# Patient Record
Sex: Female | Born: 1995 | Race: White | Hispanic: No | Marital: Single | Smoking: Former smoker
Health system: Southern US, Community
[De-identification: ages and names within clinical notes are randomized; demographics above are authoritative.]

## PROBLEM LIST (undated history)

## (undated) ENCOUNTER — Inpatient Hospital Stay (HOSPITAL_COMMUNITY): Payer: Self-pay

## (undated) DIAGNOSIS — F32A Depression, unspecified: Secondary | ICD-10-CM

## (undated) DIAGNOSIS — A749 Chlamydial infection, unspecified: Secondary | ICD-10-CM

## (undated) DIAGNOSIS — Z8659 Personal history of other mental and behavioral disorders: Secondary | ICD-10-CM

## (undated) DIAGNOSIS — F419 Anxiety disorder, unspecified: Secondary | ICD-10-CM

## (undated) DIAGNOSIS — F329 Major depressive disorder, single episode, unspecified: Secondary | ICD-10-CM

## (undated) HISTORY — DX: Personal history of other mental and behavioral disorders: Z86.59

## (undated) HISTORY — DX: Chlamydial infection, unspecified: A74.9

## (undated) HISTORY — DX: Major depressive disorder, single episode, unspecified: F32.9

## (undated) HISTORY — DX: Depression, unspecified: F32.A

## (undated) HISTORY — DX: Anxiety disorder, unspecified: F41.9

---
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
## 2016-01-06 ENCOUNTER — Encounter: Payer: Self-pay | Admitting: Obstetrics & Gynecology

## 2016-01-06 ENCOUNTER — Other Ambulatory Visit (HOSPITAL_COMMUNITY)
Admission: RE | Admit: 2016-01-06 | Discharge: 2016-01-06 | Disposition: A | Discharge status: Home / Self Care | Payer: Medicaid Other | Source: Ambulatory Visit | Attending: Obstetrics & Gynecology | Admitting: Obstetrics & Gynecology

## 2016-01-06 ENCOUNTER — Encounter (INDEPENDENT_AMBULATORY_CARE_PROVIDER_SITE_OTHER): Payer: Self-pay

## 2016-01-06 ENCOUNTER — Ambulatory Visit (INDEPENDENT_AMBULATORY_CARE_PROVIDER_SITE_OTHER): Payer: Medicaid Other | Admitting: Obstetrics & Gynecology

## 2016-01-06 VITALS — BP 118/71 | HR 98 | Ht 64.0 in | Wt 170.0 lb

## 2016-01-06 DIAGNOSIS — Z113 Encounter for screening for infections with a predominantly sexual mode of transmission: Secondary | ICD-10-CM

## 2016-01-06 DIAGNOSIS — Z3492 Encounter for supervision of normal pregnancy, unspecified, second trimester: Secondary | ICD-10-CM

## 2016-01-06 DIAGNOSIS — Z3490 Encounter for supervision of normal pregnancy, unspecified, unspecified trimester: Secondary | ICD-10-CM

## 2016-01-06 NOTE — Progress Notes (Signed)
  Subjective:    [name] is a G1P0 3311w3d being seen today for her first obstetrical visit.  Her obstetrical history is significant for THC smoker, anxiety, depression, food insecurtiy, hx of recent chlamydia infection. Patient does intend to breast feed. Pregnancy history fully reviewed.  Patient reports mild nausea nad vomiting when she doesn't eat food..  Vitals:   01/06/16 1424 01/06/16 1426  BP: 118/71   Pulse: 98   Weight: 170 lb (77.1 kg)   Height:  5\' 4"  (1.626 m)    HISTORY: OB History  Gravida Para Term Preterm AB Living  1            SAB TAB Ectopic Multiple Live Births               # Outcome Date GA Lbr Len/2nd Weight Sex Delivery Anes PTL Lv  1 Current              Past Medical History:  Diagnosis Date  . Anxiety   . Chlamydia 08/2015  . Depression    History reviewed. No pertinent surgical history. Family History  Problem Relation Age of Onset  . Diabetes Father   . Stroke Maternal Grandmother   . Diabetes Paternal Grandmother   . Breast cancer Other   . Diabetes Other   . Heart disease Other      Exam    Uterus:     Pelvic Exam:    Perineum: Hemorrhoids   Vulva: normal   Vagina:  normal mucosa   pH: n/a   Cervix: no cervical motion tenderness   Adnexa: normal adnexa   Bony Pelvis: gynecoid  System: Breast:  normal appearance, no masses or tenderness   Skin: normal coloration and turgor, no rashes, old scars from cutting on abdomen and thighs.  ?folliculitis on left lE    Neurologic: oriented, normal mood   Extremities: normal strength, tone, and muscle mass   HEENT sclera clear, anicteric   Mouth/Teeth mucous membranes moist, pharynx normal without lesions   Neck supple and no masses   Cardiovascular: regular rate and rhythm   Respiratory:  appears well, vitals normal, no respiratory distress, acyanotic, normal RR, chest clear, no wheezing, crepitations, rhonchi, normal symmetric air entry   Abdomen: soft, non-tender; bowel sounds  normal; no masses,  no organomegaly   Urinary: urethral meatus normal      Assessment:    Pregnancy: G1P0 There are no active problems to display for this patient.       Plan:     Initial labs drawn. Prenatal vitamins. Problem list reviewed and updated. Genetic Screening discussed Quad Screen: ordered.  Ultrasound discussed; fetal survey: requested.and ordered  Follow up in 4 weeks.  1. TOC for recent Chlamydia treatment 2.  [facility] for [facility] 3.  SW for food insecurity 4.  Hydrocortisone to Left lower extremity for papular lesions    Emira Eubanks H. 01/06/2016

## 2016-01-07 ENCOUNTER — Encounter: Payer: Self-pay | Admitting: *Deleted

## 2016-01-07 LAB — OBSTETRIC PANEL
Antibody Screen: NEGATIVE
BASOS PCT: 0 %
Basophils Absolute: 0 cells/uL (ref 0–200)
EOS ABS: 84 {cells}/uL (ref 15–500)
Eosinophils Relative: 1 %
HCT: 34.7 % (ref 34.0–46.0)
HEP B S AG: NEGATIVE
Hemoglobin: 11.6 g/dL (ref 11.5–15.3)
LYMPHS ABS: 1512 {cells}/uL (ref 1200–5200)
Lymphocytes Relative: 18 %
MCH: 28.5 pg (ref 25.0–35.0)
MCHC: 33.4 g/dL (ref 31.0–36.0)
MCV: 85.3 fL (ref 78.0–98.0)
MONO ABS: 588 {cells}/uL (ref 200–900)
MPV: 9.4 fL (ref 7.5–12.5)
Monocytes Relative: 7 %
NEUTROS ABS: 6216 {cells}/uL (ref 1800–8000)
Neutrophils Relative %: 74 %
Platelets: 212 10*3/uL (ref 140–400)
RBC: 4.07 MIL/uL (ref 3.80–5.10)
RDW: 15.1 % — AB (ref 11.0–15.0)
RUBELLA: 3.26 {index} — AB (ref <0.90)
Rh Type: NEGATIVE
WBC: 8.4 10*3/uL (ref 4.5–13.0)

## 2016-01-07 LAB — WET PREP BY MOLECULAR PROBE
CANDIDA SPECIES: NEGATIVE
Gardnerella vaginalis: NEGATIVE
Trichomonas vaginosis: NEGATIVE

## 2016-01-07 LAB — AFP, QUAD SCREEN
AFP: 33.3 ng/mL
Age Alone: 1:1190 {titer}
CURR GEST AGE: 15.4 wk
HCG TOTAL: 61.28 [IU]/mL
INH: 81.8 pg/mL
INTERPRETATION-AFP: NEGATIVE
MOM FOR AFP: 1.15
MOM FOR HCG: 1.45
MOM FOR INH: 0.5
OPEN SPINA BIFIDA: NEGATIVE
Osb Risk: 1:8040 {titer}
TRI 18 SCR RISK EST: NEGATIVE
uE3 Mom: 1.2
uE3 Value: 0.79 ng/mL

## 2016-01-07 LAB — HIV ANTIBODY (ROUTINE TESTING W REFLEX): HIV: NONREACTIVE

## 2016-01-08 ENCOUNTER — Encounter: Payer: Self-pay | Admitting: Obstetrics & Gynecology

## 2016-01-08 DIAGNOSIS — Z3400 Encounter for supervision of normal first pregnancy, unspecified trimester: Secondary | ICD-10-CM | POA: Insufficient documentation

## 2016-01-08 LAB — GC/CHLAMYDIA PROBE AMP (~~LOC~~) NOT AT ARMC
CHLAMYDIA, DNA PROBE: NEGATIVE
NEISSERIA GONORRHEA: NEGATIVE

## 2016-01-09 ENCOUNTER — Other Ambulatory Visit: Payer: Self-pay | Admitting: Obstetrics & Gynecology

## 2016-01-09 ENCOUNTER — Telehealth: Payer: Self-pay | Admitting: *Deleted

## 2016-01-09 DIAGNOSIS — O99891 Other specified diseases and conditions complicating pregnancy: Secondary | ICD-10-CM

## 2016-01-09 DIAGNOSIS — O9989 Other specified diseases and conditions complicating pregnancy, childbirth and the puerperium: Principal | ICD-10-CM

## 2016-01-09 DIAGNOSIS — N390 Urinary tract infection, site not specified: Secondary | ICD-10-CM

## 2016-01-09 DIAGNOSIS — R8271 Bacteriuria: Secondary | ICD-10-CM

## 2016-01-09 LAB — CULTURE, URINE COMPREHENSIVE: Colony Count: >=100000

## 2016-01-09 MED ORDER — NITROFURANTOIN MONOHYD MACRO 100 MG PO CAPS: 100.0000 mg | ORAL_CAPSULE | Freq: Two times a day (BID) | ORAL | 0 refills | Status: DC

## 2016-01-09 MED ORDER — NITROFURANTOIN MONOHYD MACRO 100 MG PO CAPS: 100.0000 mg | ORAL_CAPSULE | Freq: Two times a day (BID) | ORAL | 1 refills | Status: DC

## 2016-01-09 NOTE — Telephone Encounter (Signed)
Pt notified of UTI from urine culture.  Verbal [facility]ove [facility]  [facility] [place]

## 2016-01-13 ENCOUNTER — Inpatient Hospital Stay (HOSPITAL_COMMUNITY)
Admission: AD | Admit: 2016-01-13 | Discharge: 2016-01-13 | Disposition: A | Discharge status: Home / Self Care | Payer: Medicaid Other | Source: Ambulatory Visit | Attending: Family Medicine | Admitting: Family Medicine

## 2016-01-13 DIAGNOSIS — O219 Vomiting of pregnancy, unspecified: Secondary | ICD-10-CM | POA: Diagnosis present

## 2016-01-13 DIAGNOSIS — O2342 Unspecified infection of urinary tract in pregnancy, second trimester: Secondary | ICD-10-CM | POA: Diagnosis not present

## 2016-01-13 DIAGNOSIS — Z3A16 16 weeks gestation of pregnancy: Secondary | ICD-10-CM | POA: Insufficient documentation

## 2016-01-13 LAB — URINE MICROSCOPIC-ADD ON: RBC / HPF: NONE SEEN RBC/hpf (ref 0–5)

## 2016-01-13 LAB — URINALYSIS, ROUTINE W REFLEX MICROSCOPIC
BILIRUBIN URINE: NEGATIVE
Glucose, UA: NEGATIVE mg/dL
HGB URINE DIPSTICK: NEGATIVE
Ketones, ur: NEGATIVE mg/dL
NITRITE: POSITIVE — AB
PROTEIN: NEGATIVE mg/dL
SPECIFIC GRAVITY, URINE: 1.02 (ref 1.005–1.030)
pH: 6.5 (ref 5.0–8.0)

## 2016-01-13 MED ORDER — METOCLOPRAMIDE HCL 10 MG PO TABS: 10.0000 mg | ORAL_TABLET | Freq: Three times a day (TID) | ORAL | 1 refills | Status: DC

## 2016-01-13 MED ORDER — ONDANSETRON 8 MG PO TBDP
8.0000 mg | ORAL_TABLET | Freq: Once | ORAL | Status: AC
  Administered 2016-01-13: 8 mg via ORAL
  Filled 2016-01-13: qty 1

## 2016-01-13 MED ORDER — CEPHALEXIN 500 MG PO CAPS: 500.0000 mg | ORAL_CAPSULE | Freq: Four times a day (QID) | ORAL | 0 refills | Status: DC

## 2016-01-13 NOTE — MAU Provider Note (Signed)
History     CSN: [id number]  Arrival date and time: 01/13/16 1314   First Provider Initiated Contact with Patient 01/13/16 1520      Chief Complaint  Patient presents with  . Emesis During Pregnancy  . Fatigue   HPI   Ms.Joann Perez is a 20 y.o. female G1P0 at 5647w3d here with nausea and vomiting. She has vomited twice in the last 24 hours. Her family member told her that she needed to come in for IV fluids. Patient was recently diagnosed with a UTI, however has not picked up her RX due to cost.   Patient is homeless and feels that she has resources; she declines resources from us today.   OB History    Gravida Para Term Preterm AB Living   1             SAB TAB Ectopic Multiple Live Births                  Past Medical History:  Diagnosis Date  . Anxiety   . Chlamydia 08/2015  . Depression     No past surgical history on file.  Family History  Problem Relation Age of Onset  . Diabetes Father   . Stroke Maternal Grandmother   . Diabetes Paternal Grandmother   . Breast cancer Other   . Diabetes Other   . Heart disease Other     Social History  Substance Use Topics  . Smoking status: Former Smoker    Quit date: 01/06/2015  . Smokeless tobacco: Never Used  . Alcohol use No    Allergies:  Allergies  Allergen Reactions  . Zithromax [Azithromycin]     Prescriptions Prior to Admission  Medication Sig Dispense Refill Last Dose  . nitrofurantoin, macrocrystal-monohydrate, (MACROBID) 100 MG capsule Take 1 capsule (100 mg total) by mouth 2 (two) times daily. 20 capsule 0   . nitrofurantoin, macrocrystal-monohydrate, (MACROBID) 100 MG capsule Take 1 capsule (100 mg total) by mouth 2 (two) times daily. 14 capsule 1    Results for orders placed or performed during the hospital encounter of 01/13/16 (from the past 48 hour(s))  Urinalysis, Routine w reflex microscopic (not [facility])     Status: Abnormal   Collection Time: 01/13/16  2:05 PM  Result Value Ref Range    Color, Urine YELLOW YELLOW   APPearance CLEAR CLEAR   Specific Gravity, Urine 1.020 1.005 - 1.030   pH 6.5 5.0 - 8.0   Glucose, UA NEGATIVE NEGATIVE mg/dL   Hgb urine dipstick NEGATIVE NEGATIVE   Bilirubin Urine NEGATIVE NEGATIVE   Ketones, ur NEGATIVE NEGATIVE mg/dL   Protein, ur NEGATIVE NEGATIVE mg/dL   Nitrite POSITIVE (A) NEGATIVE   Leukocytes, UA TRACE (A) NEGATIVE  Urine microscopic-add on     Status: Abnormal   Collection Time: 01/13/16  2:05 PM  Result Value Ref Range   Squamous Epithelial / LPF 0-5 (A) NONE SEEN   WBC, UA 0-5 0 - 5 WBC/hpf   RBC / HPF NONE SEEN 0 - 5 RBC/hpf   Bacteria, UA MANY (A) NONE SEEN    Review of Systems  Constitutional: Negative for chills and fever.  Gastrointestinal: Positive for abdominal pain (Occasional burning sensation in the center of her abdomen. None now. ).  Genitourinary: Positive for dysuria.  Musculoskeletal: Negative for back pain.   Physical Exam   Blood pressure 113/63, pulse 81, temperature 98.2 F (36.8 C), temperature source Oral, resp. rate 18, height 5\' 5"  (  1.651 m), weight 173 lb (78.5 kg), last menstrual period 09/20/2015.  Physical Exam  Constitutional: She is oriented to person, place, and time. She appears well-developed and well-nourished. No distress.  HENT:  Head: Normocephalic.  Eyes: Pupils are equal, round, and reactive to light.  Musculoskeletal: Normal range of motion.  Neurological: She is alert and oriented to person, place, and time.  Skin: Skin is warm. She is not diaphoretic.  Psychiatric: Her behavior is normal.    MAU Course  Procedures  None  MDM   Urine does not show signs of dehydration  + fetal heart tones via doppler Zofran 8 mg PO; patient tolerating PO fluids and crackers. No vomiting noted while in triage.  Urine culture pending   Assessment and Plan   A:  1. UTI in pregnancy, antepartum, second trimester   2. Nausea and vomiting during pregnancy     P:  Discharge  home in stable condition Change RX from Macrobid to Keflex for UTI Rx: Reglan  Urine culture pending Return to MAU for emergencies.  Start prenatal care; patient awaiting medicaid   Duane LopeJennifer I Anastasia Tompson, NP 01/13/2016 6:10 PM

## 2016-01-13 NOTE — Discharge Instructions (Signed)
Morning Sickness °Morning sickness is when you feel sick to your stomach (nauseous) during pregnancy. You may feel sick to your stomach and throw up (vomit). You may feel sick in the morning, but you can feel this way any time of day. Some women feel very sick to their stomach and cannot stop throwing up (hyperemesis gravidarum). °HOME CARE °· Only take medicines as told by your doctor. °· Take multivitamins as told by your doctor. Taking multivitamins before getting pregnant can stop or lessen the harshness of morning sickness. °· Eat dry toast or unsalted crackers before getting out of bed. °· Eat 5 to 6 small meals a day. °· Eat dry and bland foods like rice and baked potatoes. °· Do not drink liquids with meals. Drink between meals. °· Do not eat greasy, fatty, or spicy foods. °· Have someone cook for you if the smell of food causes you to feel sick or throw up. °· If you feel sick to your stomach after taking prenatal vitamins, take them at night or with a snack. °· Eat protein when you need a snack (nuts, yogurt, cheese). °· Eat unsweetened gelatins for dessert. °· Wear a bracelet used for sea sickness (acupressure wristband). °· Go to a doctor that puts thin needles into certain body points (acupuncture) to improve how you feel. °· Do not smoke. °· Use a humidifier to keep the air in your house free of odors. °· Get lots of fresh air. °GET HELP IF: °· You need medicine to feel better. °· You feel dizzy or lightheaded. °· You are losing weight. °GET HELP RIGHT AWAY IF:  °· You feel very sick to your stomach and cannot stop throwing up. °· You pass out (faint). °MAKE SURE YOU: °· Understand these instructions. °· Will watch your condition. °· Will get help right away if you are not doing well or get worse. °  °This information is not intended to replace advice given to you by your health care provider. Make sure you discuss any questions you have with your health care provider. °  °Document Released: 06/18/2004  Document Revised: 06/01/2014 Document Reviewed: 10/26/2012 °Elsevier Interactive Patient Education ©2016 Elsevier Inc. ° °Pregnancy and Urinary Tract Infection °A urinary tract infection (UTI) is a bacterial infection of the urinary tract. Infection of the urinary tract can include the ureters, kidneys (pyelonephritis), bladder (cystitis), and urethra (urethritis). All pregnant women should be screened for bacteria in the urinary tract. Identifying and treating a UTI will decrease the risk of preterm labor and developing more serious infections in both the mother and baby. °CAUSES °Bacteria germs cause almost all UTIs.  °RISK FACTORS °Many factors can increase your chances of getting a UTI during pregnancy. These include: °· Having a short urethra. °· Poor toilet and hygiene habits. °· Sexual intercourse. °· Blockage of urine along the urinary tract. °· Problems with the pelvic muscles or nerves. °· Diabetes. °· Obesity. °· Bladder problems after having several children. °· Previous history of UTI. °SIGNS AND SYMPTOMS  °· Pain, burning, or a stinging feeling when urinating. °· Suddenly feeling the need to urinate right away (urgency). °· Loss of bladder control (urinary incontinence). °· Frequent urination, more than is common with pregnancy. °· Lower abdominal or back discomfort. °· Cloudy urine. °· Blood in the urine (hematuria). °· Fever.  °When the kidneys are infected, the symptoms may be: °· Back pain. °· Flank pain on the right side more so than the left. °· Fever. °· Chills. °· Nausea. °·   Vomiting. °DIAGNOSIS  °A urinary tract infection is usually diagnosed through urine tests. Additional tests and procedures are sometimes done. These may include: °· Ultrasound exam of the kidneys, ureters, bladder, and urethra. °· Looking in the bladder with a lighted tube (cystoscopy). °TREATMENT °Typically, UTIs can be treated with antibiotic medicines.  °HOME CARE INSTRUCTIONS  °· Only take over-the-counter or  prescription medicines as directed by your health care provider. If you were prescribed antibiotics, take them as directed. Finish them even if you start to feel better. °· Drink enough fluids to keep your urine clear or pale yellow. °· Do not have sexual intercourse until the infection is gone and your health care provider says it is okay. °· Make sure you are tested for UTIs throughout your pregnancy. These infections often come back.  °Preventing a UTI in the Future °· Practice good toilet habits. Always wipe from front to back. Use the tissue only once. °· Do not hold your urine. Empty your bladder as soon as possible when the urge comes. °· Do not douche or use deodorant sprays. °· Wash with soap and warm water around the genital area and the anus. °· Empty your bladder before and after sexual intercourse. °· Wear underwear with a cotton crotch. °· Avoid caffeine and carbonated drinks. They can irritate the bladder. °· Drink cranberry juice or take cranberry pills. This may decrease the risk of getting a UTI. °· Do not drink alcohol. °· Keep all your appointments and tests as scheduled.  °SEEK MEDICAL CARE IF:  °· Your symptoms get worse. °· You are still having fevers 2 or more days after treatment begins. °· You have a rash. °· You feel that you are having problems with medicines prescribed. °· You have abnormal vaginal discharge. °SEEK IMMEDIATE MEDICAL CARE IF:  °· You have back or flank pain. °· You have chills. °· You have blood in your urine. °· You have nausea and vomiting. °· You have contractions of your uterus. °· You have a gush of fluid from the vagina. °MAKE SURE YOU: °· Understand these instructions.   °· Will watch your condition.   °· Will get help right away if you are not doing well or get worse.   °  °This information is not intended to replace advice given to you by your health care provider. Make sure you discuss any questions you have with your health care provider. °  °Document Released:  09/05/2010 Document Revised: 03/01/2013 Document Reviewed: 12/08/2012 °Elsevier Interactive Patient Education ©2016 Elsevier Inc. ° °

## 2016-01-13 NOTE — MAU Note (Signed)
Pt C/O vomiting since yesterday, is nauseated but not vomiting today, diarrhea yesterday, none today.  Dx'd with UTI last week, hasn't started antibiotic yet.  Feeling weak, states her hair is falling out. Hasn't eaten anything down, ate approx 1/2 cup of food yesterday.

## 2016-01-14 LAB — CYSTIC FIBROSIS DIAGNOSTIC STUDY

## 2016-01-15 LAB — CULTURE, OB URINE: Special Requests: NORMAL

## 2016-01-20 ENCOUNTER — Other Ambulatory Visit (INDEPENDENT_AMBULATORY_CARE_PROVIDER_SITE_OTHER): Payer: Medicaid Other

## 2016-01-20 ENCOUNTER — Other Ambulatory Visit: Payer: Self-pay | Admitting: Advanced Practice Midwife

## 2016-01-20 DIAGNOSIS — N898 Other specified noninflammatory disorders of vagina: Secondary | ICD-10-CM

## 2016-01-20 DIAGNOSIS — Z202 Contact with and (suspected) exposure to infections with a predominantly sexual mode of transmission: Secondary | ICD-10-CM

## 2016-01-20 DIAGNOSIS — L298 Other pruritus: Secondary | ICD-10-CM

## 2016-01-20 DIAGNOSIS — Z113 Encounter for screening for infections with a predominantly sexual mode of transmission: Secondary | ICD-10-CM | POA: Diagnosis not present

## 2016-01-21 ENCOUNTER — Encounter (HOSPITAL_COMMUNITY): Payer: Self-pay | Admitting: Obstetrics & Gynecology

## 2016-01-21 ENCOUNTER — Other Ambulatory Visit: Payer: Medicaid Other

## 2016-01-21 LAB — GC/CHLAMYDIA PROBE AMP
CT PROBE, AMP APTIMA: NOT DETECTED
GC PROBE AMP APTIMA: NOT DETECTED

## 2016-01-22 ENCOUNTER — Other Ambulatory Visit: Payer: Medicaid Other

## 2016-01-22 ENCOUNTER — Ambulatory Visit (INDEPENDENT_AMBULATORY_CARE_PROVIDER_SITE_OTHER): Payer: Medicaid Other | Admitting: Obstetrics & Gynecology

## 2016-01-22 VITALS — BP 129/78 | HR 92 | Wt 172.0 lb

## 2016-01-22 DIAGNOSIS — N898 Other specified noninflammatory disorders of vagina: Secondary | ICD-10-CM | POA: Diagnosis not present

## 2016-01-22 DIAGNOSIS — O9989 Other specified diseases and conditions complicating pregnancy, childbirth and the puerperium: Secondary | ICD-10-CM

## 2016-01-22 DIAGNOSIS — O0972 Supervision of high risk pregnancy due to social problems, second trimester: Secondary | ICD-10-CM | POA: Diagnosis not present

## 2016-01-22 LAB — URINE CULTURE: Organism ID, Bacteria: NO GROWTH

## 2016-01-22 MED ORDER — FLUCONAZOLE 150 MG PO TABS: 150.0000 mg | ORAL_TABLET | Freq: Once | ORAL | 0 refills | Status: AC

## 2016-01-22 NOTE — Progress Notes (Signed)
History:  20 y.o. G1P0 here today for eval of vaginal discharge with itching and swelling.  Pt is also pregnant.  She denies vaginal spotting.  She has started feeling the baby move. She had chlamydia recently and is worried about recurrence.  She was tested yesterday and was negative for STI's.   The following portions of the patient's history were reviewed and updated as appropriate: allergies, current medications, past family history, past medical history, past social history, past surgical history and problem list.  Review of Systems:  Pertinent items are noted in HPI.  Objective:  Physical Exam Blood pressure 129/78, pulse 92, weight 172 lb (78 kg), last menstrual period 09/20/2015. Gen: NAD Abd: Soft, nontender and nondistended Pelvic: external genitalia: slightly edematous. Thick white discharge. Pt used Monistat yesterday so it was difficulty to tell what was cream and what was discharge.  Cervix long and closed. No bleeding noted.  Labs and Imaging No results found.  Assessment & Plan:  Leukorrhea and itching Suspect vaginal yeast infection  Diflucan 150mg   x1 F/u wet mount  Joann Perez, M.D., Evern CoreFACOG

## 2016-01-22 NOTE — Patient Instructions (Addendum)
Monilial Vaginitis Vaginitis in a soreness, swelling and redness (inflammation) of the vagina and vulva. Monilial vaginitis is not a sexually transmitted infection. CAUSES  Yeast vaginitis is caused by yeast (candida) that is normally found in your vagina. With a yeast infection, the candida has overgrown in number to a point that upsets the chemical balance. SYMPTOMS   White, thick vaginal discharge.  Swelling, itching, redness and irritation of the vagina and possibly the lips of the vagina (vulva).  Burning or painful urination.  Painful intercourse. DIAGNOSIS  Things that may contribute to monilial vaginitis are:  Postmenopausal and virginal states.  Pregnancy.  Infections.  Being tired, sick or stressed, especially if you had monilial vaginitis in the past.  Diabetes. Good control will help lower the chance.  Birth control pills.  Tight fitting garments.  Using bubble bath, feminine sprays, douches or deodorant tampons.  Taking certain medications that kill germs (antibiotics).  Sporadic recurrence can occur if you become ill. TREATMENT  Your caregiver will give you medication.  There are several kinds of anti monilial vaginal creams and suppositories specific for monilial vaginitis. For recurrent yeast infections, use a suppository or cream in the vagina 2 times a week, or as directed.  Anti-monilial or steroid cream for the itching or irritation of the vulva may also be used. Get your caregiver's permission.  Painting the vagina with methylene blue solution may help if the monilial cream does not work.  Eating yogurt may help prevent monilial vaginitis. HOME CARE INSTRUCTIONS   Finish all medication as prescribed.  Do not have sex until treatment is completed or after your caregiver tells you it is okay.  Take warm sitz baths.  Do not douche.  Do not use tampons, especially scented ones.  Wear cotton underwear.  Avoid tight pants and panty  hose.  Tell your sexual partner that you have a yeast infection. They should go to their caregiver if they have symptoms such as mild rash or itching.  Your sexual partner should be treated as well if your infection is difficult to eliminate.  Practice safer sex. Use condoms.  Some vaginal medications cause latex condoms to fail. Vaginal medications that harm condoms are:  Cleocin cream.  Butoconazole (Femstat).  Terconazole (Terazol) vaginal suppository.  Miconazole (Monistat) (may be purchased over the counter). SEEK MEDICAL CARE IF:   You have a temperature by mouth above 102 F (38.9 C).  The infection is getting worse after 2 days of treatment.  The infection is not getting better after 3 days of treatment.  You develop blisters in or around your vagina.  You develop vaginal bleeding, and it is not your menstrual period.  You have pain when you urinate.  You develop intestinal problems.  You have pain with sexual intercourse.   This information is not intended to replace advice given to you by your health care provider. Make sure you discuss any questions you have with your health care provider.   Document Released: 02/18/2005 Document Revised: 08/03/2011 Document Reviewed: 11/12/2014 Elsevier Interactive Patient Education 2016 Elsevier Inc.  GO WHITE: Soap: UNSCENTED Dove (white box light green writing) Laundry detergent (underwear)- Dreft or [employer] unscented WHITE 100% cotton panties (NOT just cotton crouch) Sanitary napkin/panty liners: UNSCENTED.  If it doesn't SAY unscented it can have a scent/perfume    NO PERFUMES OR LOTIONS OR POTIONS in the vulvar area (may use regular [place]) Condoms: hypoallergenic only. Non dyed (no color) Toilet papers: white only Wash clothes: use  a separate wash cloth. WHITE.  Washed in Dreft.

## 2016-01-22 NOTE — Progress Notes (Signed)
Pt states that she had severe cramping last night.

## 2016-01-23 ENCOUNTER — Telehealth: Payer: Self-pay

## 2016-01-23 ENCOUNTER — Other Ambulatory Visit: Payer: Self-pay

## 2016-01-23 ENCOUNTER — Other Ambulatory Visit: Payer: Self-pay | Admitting: Obstetrics & Gynecology

## 2016-01-23 LAB — WET PREP FOR TRICH, YEAST, CLUE
TRICH WET PREP: NONE SEEN
Yeast Wet Prep HPF POC: NONE SEEN

## 2016-01-23 MED ORDER — METRONIDAZOLE 500 MG PO TABS: 500.0000 mg | ORAL_TABLET | Freq: Two times a day (BID) | ORAL | 0 refills | Status: DC

## 2016-01-23 NOTE — Telephone Encounter (Signed)
Called pt to tell her that she has BV and that we sent in a prescription but, someone else answered the phone so I didn't tell them the results and I asked them to have her call me back

## 2016-01-24 ENCOUNTER — Telehealth: Payer: Self-pay

## 2016-01-24 NOTE — Telephone Encounter (Signed)
Spoke with pt and she is aware that she has BV and that a prescription has been sent to her pharmacy

## 2016-01-31 ENCOUNTER — Ambulatory Visit (HOSPITAL_COMMUNITY): Payer: Medicaid Other

## 2016-02-03 ENCOUNTER — Ambulatory Visit (INDEPENDENT_AMBULATORY_CARE_PROVIDER_SITE_OTHER): Payer: Medicaid Other | Admitting: Advanced Practice Midwife

## 2016-02-03 ENCOUNTER — Ambulatory Visit (HOSPITAL_COMMUNITY)
Admission: RE | Admit: 2016-02-03 | Discharge: 2016-02-03 | Disposition: A | Discharge status: Home / Self Care | Payer: Medicaid Other | Source: Ambulatory Visit | Attending: Obstetrics & Gynecology | Admitting: Obstetrics & Gynecology

## 2016-02-03 VITALS — BP 122/65 | HR 88 | Wt 174.0 lb

## 2016-02-03 DIAGNOSIS — Z3482 Encounter for supervision of other normal pregnancy, second trimester: Secondary | ICD-10-CM

## 2016-02-03 DIAGNOSIS — N898 Other specified noninflammatory disorders of vagina: Secondary | ICD-10-CM

## 2016-02-03 DIAGNOSIS — Z3A19 19 weeks gestation of pregnancy: Secondary | ICD-10-CM | POA: Insufficient documentation

## 2016-02-03 DIAGNOSIS — Z36 Encounter for antenatal screening of mother: Secondary | ICD-10-CM | POA: Diagnosis not present

## 2016-02-03 DIAGNOSIS — O9989 Other specified diseases and conditions complicating pregnancy, childbirth and the puerperium: Secondary | ICD-10-CM

## 2016-02-03 DIAGNOSIS — Z3492 Encounter for supervision of normal pregnancy, unspecified, second trimester: Secondary | ICD-10-CM

## 2016-02-03 DIAGNOSIS — Z3490 Encounter for supervision of normal pregnancy, unspecified, unspecified trimester: Secondary | ICD-10-CM

## 2016-02-04 LAB — WET PREP FOR TRICH, YEAST, CLUE
TRICH WET PREP: NONE SEEN
YEAST WET PREP: NONE SEEN

## 2016-02-06 NOTE — Progress Notes (Signed)
   PRENATAL VISIT NOTE  Subjective:  Joann Perez is a 20 y.o. G1P0 at 7253w3d being seen today for ongoing prenatal care.  She is currently monitored for the following issues for this low-risk pregnancy and has Supervision of high risk pregnancy due to social problems and Asymptomatic bacteriuria during pregnancy on her problem list.  Patient reports vaginal irritation.  Contractions: Not present. Vag. Bleeding: None.  Movement: Present. Denies leaking of fluid.   The following portions of the patient's history were reviewed and updated as appropriate: allergies, current medications, past family history, past medical history, past social history, past surgical history and problem list. Problem list updated.  Objective:   Vitals:   02/03/16 0955  BP: 122/65  Pulse: 88  Weight: 174 lb (78.9 kg)    Fetal Status: Fetal Heart Rate (bpm): 141   Movement: Present     General:  Alert, oriented and cooperative. Patient is in no acute distress.  Skin: Skin is warm and dry. No rash noted.   Cardiovascular: Normal heart rate noted  Respiratory: Normal respiratory effort, no problems with respiration noted  Abdomen: Soft, gravid, appropriate for gestational age. Pain/Pressure: Present     Pelvic:  Cervical exam deferred      physiologic discharge. No VB.   Extremities: Normal range of motion.  Edema: Trace  Mental Status: Normal mood and affect. Normal behavior. Normal judgment and thought content.   Urinalysis:      Assessment and Plan:  Pregnancy: G1P0 at 1747w6d  1. Vaginal irritation  - WET PREP FOR TRICH, YEAST, CLUE  Preterm labor symptoms and general obstetric precautions including but not limited to vaginal bleeding, contractions, leaking of fluid and fetal movement were reviewed in detail with the patient. Please refer to After Visit Summary for other counseling recommendations.  F/U 4 weeks  Dorathy KinsmanVirginia Radin Raptis, [place]

## 2016-02-06 NOTE — Patient Instructions (Signed)

## 2016-02-07 ENCOUNTER — Telehealth: Payer: Self-pay | Admitting: *Deleted

## 2016-02-07 DIAGNOSIS — O0992 Supervision of high risk pregnancy, unspecified, second trimester: Secondary | ICD-10-CM

## 2016-02-07 NOTE — Telephone Encounter (Signed)
Order placed in system for follow up OB U/S per Dr Penne LashLeggett.

## 2016-02-07 NOTE — Telephone Encounter (Signed)
LM with pt's boyfriend that she has a Engineer, manufacturing systemsX @ [facility].  I told him to have patient call the office.

## 2016-02-07 NOTE — Telephone Encounter (Signed)
-----   Message from [place], [place] sent at 02/07/2016  3:48 AM EDT ----- Dx BV, Rx Flagyl 500 mg PO BID x 7 days. #14. No RF.

## 2016-02-10 ENCOUNTER — Other Ambulatory Visit: Payer: Self-pay | Admitting: *Deleted

## 2016-02-10 DIAGNOSIS — N760 Acute vaginitis: Principal | ICD-10-CM

## 2016-02-10 DIAGNOSIS — B9689 Other specified bacterial agents as the cause of diseases classified elsewhere: Secondary | ICD-10-CM

## 2016-02-10 MED ORDER — METRONIDAZOLE 500 MG PO TABS: 500.0000 mg | ORAL_TABLET | Freq: Two times a day (BID) | ORAL | 0 refills | Status: DC

## 2016-02-13 ENCOUNTER — Telehealth: Payer: Self-pay

## 2016-02-13 NOTE — Telephone Encounter (Signed)
Pt called and stated that she has head lice and wanted to know if it was okay to use any of the OTC products to kill the head lice. After doing some research, the other nurse and I told her to use "LiceMD"

## 2016-03-02 ENCOUNTER — Ambulatory Visit (INDEPENDENT_AMBULATORY_CARE_PROVIDER_SITE_OTHER): Payer: Medicaid Other | Admitting: Certified Nurse Midwife

## 2016-03-02 VITALS — BP 107/63 | HR 86 | Wt 185.0 lb

## 2016-03-02 DIAGNOSIS — F418 Other specified anxiety disorders: Secondary | ICD-10-CM

## 2016-03-02 DIAGNOSIS — F419 Anxiety disorder, unspecified: Principal | ICD-10-CM

## 2016-03-02 DIAGNOSIS — Z87898 Personal history of other specified conditions: Secondary | ICD-10-CM

## 2016-03-02 DIAGNOSIS — F1911 Other psychoactive substance abuse, in remission: Secondary | ICD-10-CM | POA: Insufficient documentation

## 2016-03-02 DIAGNOSIS — F32A Depression, unspecified: Secondary | ICD-10-CM

## 2016-03-02 DIAGNOSIS — O99342 Other mental disorders complicating pregnancy, second trimester: Secondary | ICD-10-CM

## 2016-03-02 DIAGNOSIS — Z3400 Encounter for supervision of normal first pregnancy, unspecified trimester: Secondary | ICD-10-CM

## 2016-03-02 DIAGNOSIS — R45851 Suicidal ideations: Secondary | ICD-10-CM

## 2016-03-02 DIAGNOSIS — F329 Major depressive disorder, single episode, unspecified: Secondary | ICD-10-CM | POA: Insufficient documentation

## 2016-03-02 NOTE — Progress Notes (Signed)
Subjective:  Joann Perez is a 20 y.o. G1P0 at 5745w3d being seen today for ongoing prenatal care.  She is currently monitored for the following issues for this low-risk pregnancy and has Supervision of normal first pregnancy, antepartum; Asymptomatic bacteriuria during pregnancy; Anxiety and depression; and History of drug abuse on her problem list.  Patient reports fatigue.  Contractions: Not present. Vag. Bleeding: None.  Movement: Present. Denies leaking of fluid.  Weight gain good, getting food stamps now so has better food supply.   She reports worsening anxiety and feeling depressed at times. During these times her boyfriend suggests that she go for a walk to distract her. She reports so improvement with distraction. She admits to thoughts of harming herself, says "ya, everybody thinks about that, but I wouldn't do anything". She denies having a plan for suicide. She reports hx of depression since age 20, cutting, and bulemia. Not currently seeing a therapist, had one in the past but "did not click". She is considering antidepressant medication but is hesitant d/t pregnancy. She is agreeable to restart psychotherapy.  The following portions of the patient's history were reviewed and updated as appropriate: allergies, current medications, past family history, past medical history, past social history, past surgical history and problem list. Problem list updated.  Objective:   Vitals:   03/02/16 0939  BP: 107/63  Pulse: 86  Weight: 185 lb (83.9 kg)    Fetal Status: Fetal Heart Rate (bpm): 140 Fundal Height: 23 cm Movement: Present     General:  Alert, oriented and cooperative. Patient is in no acute distress.  Skin: Skin is warm and dry. No rash noted.   Cardiovascular: Normal heart rate noted  Respiratory: Normal respiratory effort, no problems with respiration noted  Abdomen: Soft, gravid, appropriate for gestational age. Pain/Pressure: Present     Pelvic: Vag. Bleeding: None Vag D/C  Character: Thin   Cervical exam deferred        Extremities: Normal range of motion.  Edema: Trace  Mental Status: Normal mood and affect. Normal behavior. Normal judgment and thought content.   Urinalysis:      Assessment and Plan:  Pregnancy: G1P0 at 6345w3d  1. Anxiety and depression - Ambulatory referral to Behavioral Health- scheduled 03/05/16 - referral today if available, pt refused to go today - consider starting antidepressant when pt agrees  2. Suicidal ideations - no action plan, in safe environment with good social support - Ambulatory referral to Behavioral Health  3. Supervision of normal first pregnancy - f/u US for incomplete anatomy  4. History of drug abuse - needs UDS  Preterm labor symptoms and general obstetric precautions including but not limited to vaginal bleeding, contractions, leaking of fluid and fetal movement were reviewed in detail with the patient. Please refer to After Visit Summary for other counseling recommendations.  Return in about 4 weeks (around 03/30/2016).   Donette LarryMelanie Moiz Ryant, CNM

## 2016-03-05 ENCOUNTER — Ambulatory Visit (HOSPITAL_COMMUNITY): Payer: Medicaid Other | Admitting: Psychiatry

## 2016-03-09 ENCOUNTER — Encounter (HOSPITAL_COMMUNITY): Payer: Self-pay

## 2016-03-09 ENCOUNTER — Ambulatory Visit (HOSPITAL_COMMUNITY)
Admission: RE | Admit: 2016-03-09 | Discharge: 2016-03-09 | Disposition: A | Discharge status: Home / Self Care | Payer: Medicaid Other | Source: Ambulatory Visit | Attending: Obstetrics & Gynecology | Admitting: Obstetrics & Gynecology

## 2016-03-09 DIAGNOSIS — F1911 Other psychoactive substance abuse, in remission: Secondary | ICD-10-CM

## 2016-03-09 DIAGNOSIS — Z362 Encounter for other antenatal screening follow-up: Secondary | ICD-10-CM | POA: Diagnosis present

## 2016-03-09 DIAGNOSIS — Z3400 Encounter for supervision of normal first pregnancy, unspecified trimester: Secondary | ICD-10-CM

## 2016-03-09 DIAGNOSIS — O0992 Supervision of high risk pregnancy, unspecified, second trimester: Secondary | ICD-10-CM

## 2016-03-09 DIAGNOSIS — F329 Major depressive disorder, single episode, unspecified: Secondary | ICD-10-CM

## 2016-03-09 DIAGNOSIS — O99342 Other mental disorders complicating pregnancy, second trimester: Secondary | ICD-10-CM | POA: Insufficient documentation

## 2016-03-09 DIAGNOSIS — Z3A24 24 weeks gestation of pregnancy: Secondary | ICD-10-CM | POA: Insufficient documentation

## 2016-03-09 DIAGNOSIS — F419 Anxiety disorder, unspecified: Secondary | ICD-10-CM | POA: Diagnosis not present

## 2016-03-16 ENCOUNTER — Encounter: Payer: Medicaid Other | Admitting: Obstetrics & Gynecology

## 2016-03-17 NOTE — Progress Notes (Signed)
DNKA

## 2016-03-30 ENCOUNTER — Ambulatory Visit (INDEPENDENT_AMBULATORY_CARE_PROVIDER_SITE_OTHER): Payer: Medicaid Other | Admitting: Advanced Practice Midwife

## 2016-03-30 VITALS — BP 117/69 | HR 103 | Wt 193.0 lb

## 2016-03-30 DIAGNOSIS — O360990 Maternal care for other rhesus isoimmunization, unspecified trimester, not applicable or unspecified: Secondary | ICD-10-CM

## 2016-03-30 DIAGNOSIS — Z23 Encounter for immunization: Secondary | ICD-10-CM

## 2016-03-30 DIAGNOSIS — Z87898 Personal history of other specified conditions: Secondary | ICD-10-CM

## 2016-03-30 DIAGNOSIS — F1991 Other psychoactive substance use, unspecified, in remission: Secondary | ICD-10-CM

## 2016-03-30 DIAGNOSIS — Z3400 Encounter for supervision of normal first pregnancy, unspecified trimester: Secondary | ICD-10-CM

## 2016-03-30 MED ORDER — RHO D IMMUNE GLOBULIN 1500 UNIT/2ML IJ SOSY
300.0000 ug | PREFILLED_SYRINGE | Freq: Once | INTRAMUSCULAR | Status: AC
  Administered 2016-03-30: 300 ug via INTRAMUSCULAR

## 2016-03-30 NOTE — Patient Instructions (Addendum)
Preterm Labor Information Preterm labor is when labor starts at less than 37 weeks of pregnancy. The normal length of a pregnancy is 39 to 41 weeks. CAUSES Often, there is no identifiable underlying cause as to why a woman goes into preterm labor. One of the most common known causes of preterm labor is infection. Infections of the uterus, cervix, vagina, amniotic sac, bladder, kidney, or even the lungs (pneumonia) can cause labor to start. Other suspected causes of preterm labor include:   Urogenital infections, such as yeast infections and bacterial vaginosis.   Uterine abnormalities (uterine shape, uterine septum, fibroids, or bleeding from the placenta).   A cervix that has been operated on (it may fail to stay closed).   Malformations in the fetus.   Multiple gestations (twins, triplets, and so on).   Breakage of the amniotic sac.  RISK FACTORS  Having a previous history of preterm labor.   Having premature rupture of membranes (PROM).   Having a placenta that covers the opening of the cervix (placenta previa).   Having a placenta that separates from the uterus (placental abruption).   Having a cervix that is too weak to hold the fetus in the uterus (incompetent cervix).   Having too much fluid in the amniotic sac (polyhydramnios).   Taking illegal drugs or smoking while pregnant.   Not gaining enough weight while pregnant.   Being younger than 7618 and older than 19 years old.   Having a low socioeconomic status.   Being African American. SYMPTOMS Signs and symptoms of preterm labor include:   Menstrual-like cramps, abdominal pain, or back pain.  Uterine contractions that are regular, as frequent as six in an hour, regardless of their intensity (may be mild or painful).  Contractions that start on the top of the uterus and spread down to the lower abdomen and back.   A sense of increased pelvic pressure.   A watery or bloody mucus discharge  that comes from the vagina.  TREATMENT Depending on the length of the pregnancy and other circumstances, your health care provider may suggest bed rest. If necessary, there are medicines that can be given to stop contractions and to mature the fetal lungs. If labor happens before 34 weeks of pregnancy, a prolonged hospital stay may be recommended. Treatment depends on the condition of both you and the fetus.  WHAT SHOULD YOU DO IF YOU THINK YOU ARE IN PRETERM LABOR? Call your health care provider right away. You will need to go to the hospital to get checked immediately. HOW CAN YOU PREVENT PRETERM LABOR IN FUTURE PREGNANCIES? You should:   Stop smoking if you smoke.  Maintain healthy weight gain and avoid chemicals and drugs that are not necessary.  Be watchful for any type of infection.  Inform your health care provider if you have a known history of preterm labor.   This information is not intended to replace advice given to you by your health care provider. Make sure you discuss any questions you have with your health care provider.   Document Released: 08/01/2003 Document Revised: 01/11/2013 Document Reviewed: 06/13/2012 Elsevier Interactive Patient Education 2016 [employer]    Tdap Vaccine (Tetanus, Diphtheria and Pertussis): What You Need to Know 1. Why get vaccinated? Tetanus, diphtheria and pertussis are very serious diseases. Tdap vaccine can protect us from these diseases. And, Tdap vaccine given to pregnant women can protect newborn babies against pertussis. TETANUS (Lockjaw) is rare in the [place] today. It causes painful muscle  tightening and stiffness, usually all over the body.  It can lead to tightening of muscles in the head and neck so you can't open your mouth, swallow, or sometimes even breathe. Tetanus kills about 1 out of 10 people who are infected even after receiving the best medical care. DIPHTHERIA is also rare in the [place] today. It can  cause a thick coating to form in the back of the throat.  It can lead to breathing problems, heart failure, paralysis, and death. PERTUSSIS (Whooping Cough) causes severe coughing spells, which can cause difficulty breathing, vomiting and disturbed sleep.  It can also lead to weight loss, incontinence, and rib fractures. Up to 2 in 100 adolescents and 5 in 100 adults with pertussis are hospitalized or have complications, which could include pneumonia or death. These diseases are caused by bacteria. Diphtheria and pertussis are spread from person to person through secretions from coughing or sneezing. Tetanus enters the body through cuts, scratches, or wounds. Before vaccines, as many as 200,000 cases of diphtheria, 200,000 cases of pertussis, and hundreds of cases of tetanus, were reported in the [place] each year. Since vaccination began, reports of cases for tetanus and diphtheria have dropped by about 99% and for pertussis by about 80%. 2. Tdap vaccine Tdap vaccine can protect adolescents and adults from tetanus, diphtheria, and pertussis. One dose of Tdap is routinely given at age 19 or 212. People who did not get Tdap at that age should get it as soon as possible. Tdap is especially important for healthcare professionals and anyone having close contact with a baby younger than 12 months. Pregnant women should get a dose of Tdap during every pregnancy, to protect the newborn from pertussis. Infants are most at risk for severe, life-threatening complications from pertussis. Another vaccine, called Td, protects against tetanus and diphtheria, but not pertussis. A Td booster should be given every 10 years. Tdap may be given as one of these boosters if you have never gotten Tdap before. Tdap may also be given after a severe cut or burn to prevent tetanus infection. Your doctor or the person giving you the vaccine can give you more information. Tdap may safely be given at the same time as other  vaccines. 3. Some people should not get this vaccine  A person who has ever had a life-threatening allergic reaction after a previous dose of any diphtheria, tetanus or pertussis containing vaccine, OR has a severe allergy to any part of this vaccine, should not get Tdap vaccine. Tell the person giving the vaccine about any severe allergies.  Anyone who had coma or long repeated seizures within 7 days after a childhood dose of DTP or DTaP, or a previous dose of Tdap, should not get Tdap, unless a cause other than the vaccine was found. They can still get Td.  Talk to your doctor if you:  have seizures or another nervous system problem,  had severe pain or swelling after any vaccine containing diphtheria, tetanus or pertussis,  ever had a condition called Guillain-Barr Syndrome (GBS),  aren't feeling well on the day the shot is scheduled. 4. Risks With any medicine, including vaccines, there is a chance of side effects. These are usually mild and go away on their own. Serious reactions are also possible but are rare. Most people who get Tdap vaccine do not have any problems with it. Mild problems following Tdap (Did not interfere with activities)  Pain where the shot was given (about 3 in 4  adolescents or 2 in 3 adults)  Redness or swelling where the shot was given (about 1 person in 5)  Mild fever of at least 100.39F (up to about 1 in 25 adolescents or 1 in 100 adults)  Headache (about 3 or 4 people in 10)  Tiredness (about 1 person in 3 or 4)  Nausea, vomiting, diarrhea, stomach ache (up to 1 in 4 adolescents or 1 in 10 adults)  Chills, sore joints (about 1 person in 10)  Body aches (about 1 person in 3 or 4)  Rash, swollen glands (uncommon) Moderate problems following Tdap (Interfered with activities, but did not require medical attention)  Pain where the shot was given (up to 1 in 5 or 6)  Redness or swelling where the shot was given (up to about 1 in 16 adolescents or  1 in 12 adults)  Fever over 102F (about 1 in 100 adolescents or 1 in 250 adults)  Headache (about 1 in 7 adolescents or 1 in 10 adults)  Nausea, vomiting, diarrhea, stomach ache (up to 1 or 3 people in 100)  Swelling of the entire arm where the shot was given (up to about 1 in 500). Severe problems following Tdap (Unable to perform usual activities; required medical attention)  Swelling, severe pain, bleeding and redness in the arm where the shot was given (rare). Problems that could happen after any vaccine:  People sometimes faint after a medical procedure, including vaccination. Sitting or lying down for about 15 minutes can help prevent fainting, and injuries caused by a fall. Tell your doctor if you feel dizzy, or have vision changes or ringing in the ears.  Some people get severe pain in the shoulder and have difficulty moving the arm where a shot was given. This happens very rarely.  Any medication can cause a severe allergic reaction. Such reactions from a vaccine are very rare, estimated at fewer than 1 in a million doses, and would happen within a few minutes to a few hours after the vaccination. As with any medicine, there is a very remote chance of a vaccine causing a serious injury or death. The safety of vaccines is always being monitored. For more information, visit: http://floyd.org/ 5. What if there is a serious problem? What should I look for?  Look for anything that concerns you, such as signs of a severe allergic reaction, very high fever, or unusual behavior.  Signs of a severe allergic reaction can include hives, swelling of the face and throat, difficulty breathing, a fast heartbeat, dizziness, and weakness. These would usually start a few minutes to a few hours after the vaccination. What should I do?  If you think it is a severe allergic reaction or other emergency that can't wait, call 9-1-1 or get the person to the nearest hospital. Otherwise, call  your doctor.  Afterward, the reaction should be reported to the Vaccine Adverse Event Reporting System (VAERS). Your doctor might file this report, or you can do it yourself through the VAERS web site at www.vaers.LAgents.no, or by calling 1-(747)183-7199. VAERS does not give medical advice.  6. The National Vaccine Injury Compensation Program The [employer] Vaccine Injury Compensation Program (VICP) is a federal program that was created to compensate people who may have been injured by certain vaccines. Persons who believe they may have been injured by a vaccine can learn about the program and about filing a claim by calling 1-580-835-9279 or visiting the VICP website at SpiritualWord.at. There is a time limit to  file a claim for compensation. 7. How can I learn more?  Ask your doctor. He or she can give you the vaccine package insert or suggest other sources of information.  Call your local or state health department.  Contact the Centers for Disease Control and Prevention (CDC):  Call (639)802-9610 (1-800-CDC-INFO) or  Visit CDC's website at PicCapture.uy CDC Tdap Vaccine VIS (07/18/13)   This information is not intended to replace advice given to you by your health care provider. Make sure you discuss any questions you have with your health care provider.   Document Released: 11/10/2011 Document Revised: 06/01/2014 Document Reviewed: 08/23/2013 Elsevier Interactive Patient Education 2016 Elsevier Inc.   Rh0 [D] Immune Globulin injection What is this medicine? RhO [D] IMMUNE GLOBULIN (i MYOON GLOB yoo lin) is used to treat idiopathic thrombocytopenic purpura (ITP). This medicine is used in RhO negative mothers who are pregnant with a RhO positive child. It is also used after a transfusion of RhO positive blood into a RhO negative person. This medicine may be used for other purposes; ask your health care provider or pharmacist if you have questions. What should I  tell my health care provider before I take this medicine? They need to know if you have any of these conditions: -bleeding disorders -low levels of immunoglobulin A in the body -no spleen -an unusual or allergic reaction to human immune globulin, other medicines, foods, dyes, or preservatives -pregnant or trying to get pregnant -breast-feeding How should I use this medicine? This medicine is for injection into a muscle or into a vein. It is given by a health care professional in a hospital or clinic setting. Talk to your pediatrician regarding the use of this medicine in children. This medicine is not approved for use in children. Overdosage: If you think you have taken too much of this medicine contact a poison control center or emergency room at once. NOTE: This medicine is only for you. Do not share this medicine with others. What if I miss a dose? It is important not to miss your dose. Call your doctor or health care professional if you are unable to keep an appointment. What may interact with this medicine? -live virus vaccines, like measles, mumps, or rubella This list may not describe all possible interactions. Give your health care provider a list of all the medicines, herbs, non-prescription drugs, or dietary supplements you use. Also tell them if you smoke, drink alcohol, or use illegal drugs. Some items may interact with your medicine. What should I watch for while using this medicine? This medicine is made from human blood. It may be possible to pass an infection in this medicine. Talk to your doctor about the risks and benefits of this medicine. This medicine may interfere with live virus vaccines. Before you get live virus vaccines tell your health care professional if you have received this medicine within the past 3 months. What side effects may I notice from receiving this medicine? Side effects that you should report to your doctor or health care professional as soon as  possible: -allergic reactions like skin rash, itching or hives, swelling of the face, lips, or tongue -breathing problems -chest pain or tightness -yellowing of the eyes or skin Side effects that usually do not require medical attention (report to your doctor or health care professional if they continue or are bothersome): -fever -pain and tenderness at site where injected This list may not describe all possible side effects. Call your doctor for medical advice  about side effects. You may report side effects to FDA at 1-800-FDA-1088. Where should I keep my medicine? This drug is given in a hospital or clinic and will not be stored at home. NOTE: This sheet is a summary. It may not cover all possible information. If you have questions about this medicine, talk to your doctor, pharmacist, or health care provider.    2016, Elsevier/Gold Standard. (2008-01-09 14:06:10)

## 2016-03-31 ENCOUNTER — Telehealth: Payer: Self-pay

## 2016-03-31 LAB — CBC
HCT: 28.8 % — ABNORMAL LOW (ref 35.0–45.0)
HEMOGLOBIN: 9.4 g/dL — AB (ref 11.7–15.5)
MCH: 28.3 pg (ref 27.0–33.0)
MCHC: 32.6 g/dL (ref 32.0–36.0)
MCV: 86.7 fL (ref 80.0–100.0)
MPV: 9.6 fL (ref 7.5–12.5)
PLATELETS: 205 10*3/uL (ref 140–400)
RBC: 3.32 MIL/uL — ABNORMAL LOW (ref 3.80–5.10)
RDW: 13.4 % (ref 11.0–15.0)
WBC: 8.1 10*3/uL (ref 3.8–10.8)

## 2016-03-31 LAB — ANTIBODY SCREEN: Antibody Screen: NEGATIVE

## 2016-03-31 LAB — RPR

## 2016-03-31 LAB — GLUCOSE TOLERANCE, 1 HOUR (50G) W/O FASTING: GLUCOSE, 1 HR, GESTATIONAL: 97 mg/dL (ref <140)

## 2016-03-31 LAB — HIV ANTIBODY (ROUTINE TESTING W REFLEX): HIV: NONREACTIVE

## 2016-03-31 NOTE — Telephone Encounter (Signed)
Spoke with pt and she is aware that her labs are normal and that she passed her GTT

## 2016-04-01 NOTE — Progress Notes (Signed)
   PRENATAL VISIT NOTE  Subjective:  Joann Perez is a 20 y.o. G1P0 at 7841w3d being seen today for ongoing prenatal care.  She is currently monitored for the following issues for this low-risk pregnancy and has Supervision of normal first pregnancy, antepartum; Asymptomatic bacteriuria during pregnancy; Anxiety and depression; and History of drug abuse on her problem list.  Patient reports decreased fetal mvmt a few days ago, but normal since then.  Contractions: Not present. Vag. Bleeding: None.  Movement: (!) Decreased. Denies leaking of fluid.   The following portions of the patient's history were reviewed and updated as appropriate: allergies, current medications, past family history, past medical history, past social history, past surgical history and problem list. Problem list updated.  Objective:   Vitals:   03/30/16 1045  BP: 117/69  Pulse: (!) 103  Weight: 193 lb (87.5 kg)    Fetal Status: Fetal Heart Rate (bpm): 145 Fundal Height: 27 cm Movement: (!) Decreased     General:  Alert, oriented and cooperative. Patient is in no acute distress.  Skin: Skin is warm and dry. No rash noted.   Cardiovascular: Normal heart rate noted  Respiratory: Normal respiratory effort, no problems with respiration noted  Abdomen: Soft, gravid, appropriate for gestational age. Pain/Pressure: Present     Pelvic:  Cervical exam deferred        Extremities: Normal range of motion.  Edema: Trace  Mental Status: Normal mood and affect. Normal behavior. Normal judgment and thought content.   Assessment and Plan:  Pregnancy: G1P0 at 8165w5d  1. History of drug use  - Prescription Abuse Monitoring, 17 Panel  2. Supervision of normal first pregnancy, antepartum  - CBC - Glucose Tolerance, 1 HR (50g) w/o Fasting - HIV antibody (with reflex) - RPR - rho (d) immune globulin (RHIG/RHOPHYLAC) injection 300 mcg; Inject 2 mLs (300 mcg total) into the muscle once.  3. Rhesus isoimmunization during  pregnancy, antepartum, single or unspecified fetus  - rho (d) immune globulin (RHIG/RHOPHYLAC) injection 300 mcg; Inject 2 mLs (300 mcg total) into the muscle once. - Antibody screen; Future - Antibody screen  4. Need for DTaP vaccine  - Tdap vaccine greater than or equal to 7yo IM  Preterm labor symptoms and general obstetric precautions including but not limited to vaginal bleeding, contractions, leaking of fluid and fetal movement were reviewed in detail with the patient. Please refer to After Visit Summary for other counseling recommendations.  Return in 2 weeks (on 04/13/2016) for ROB.   Dorathy KinsmanVirginia Tomislav Micale, CNM

## 2016-04-06 LAB — PRESCRIPTION ABUSE MONITORING 17P, URINE
6-ACETYLMORPHINE, URINE: NEGATIVE ng/mL
Amphetamine Scrn, Ur: NEGATIVE ng/mL
BARBITURATE SCREEN URINE: NEGATIVE ng/mL
BENZODIAZEPINE SCREEN, URINE: NEGATIVE ng/mL
BUPRENORPHINE, URINE: NEGATIVE ng/mL
COCAINE(METAB.)SCREEN, URINE: NEGATIVE ng/mL
CREATININE(CRT), U: 123.6 mg/dL (ref 20.0–300.0)
Carisoprodol/Meprobamate, Ur: NEGATIVE ng/mL
EDDP, Urine: NEGATIVE ng/mL
Fentanyl, Urine: NEGATIVE pg/mL
MDMA SCREEN, URINE: NEGATIVE ng/mL
MEPERIDINE SCREEN, URINE: NEGATIVE ng/mL
METHADONE SCREEN, URINE: NEGATIVE ng/mL
Nitrite Urine, Quantitative: NEGATIVE ug/mL
OPIATE SCREEN URINE: NEGATIVE ng/mL
OXYCODONE+OXYMORPHONE UR QL SCN: NEGATIVE ng/mL
PHENCYCLIDINE QUANTITATIVE URINE: NEGATIVE ng/mL
Ph of Urine: 6.2 (ref 4.5–8.9)
Propoxyphene Scrn, Ur: NEGATIVE ng/mL
SPECIFIC GRAVITY: 1.021
Tapentadol, Urine: NEGATIVE ng/mL
Tramadol Screen, Urine: NEGATIVE ng/mL

## 2016-04-06 LAB — CANNABINOID (GC/MS), URINE
CANNABINOID UR: POSITIVE — AB
CARBOXY THC UR: 36 ng/mL

## 2016-04-13 ENCOUNTER — Ambulatory Visit (INDEPENDENT_AMBULATORY_CARE_PROVIDER_SITE_OTHER): Payer: Medicaid Other | Admitting: Advanced Practice Midwife

## 2016-04-13 VITALS — BP 123/70 | HR 87 | Wt 195.0 lb

## 2016-04-13 DIAGNOSIS — O99013 Anemia complicating pregnancy, third trimester: Secondary | ICD-10-CM

## 2016-04-13 DIAGNOSIS — K5900 Constipation, unspecified: Secondary | ICD-10-CM

## 2016-04-13 DIAGNOSIS — O99619 Diseases of the digestive system complicating pregnancy, unspecified trimester: Principal | ICD-10-CM

## 2016-04-13 DIAGNOSIS — O99613 Diseases of the digestive system complicating pregnancy, third trimester: Secondary | ICD-10-CM

## 2016-04-13 DIAGNOSIS — B9689 Other specified bacterial agents as the cause of diseases classified elsewhere: Secondary | ICD-10-CM

## 2016-04-13 DIAGNOSIS — N760 Acute vaginitis: Secondary | ICD-10-CM

## 2016-04-13 DIAGNOSIS — K219 Gastro-esophageal reflux disease without esophagitis: Secondary | ICD-10-CM

## 2016-04-13 DIAGNOSIS — Z3400 Encounter for supervision of normal first pregnancy, unspecified trimester: Secondary | ICD-10-CM

## 2016-04-13 DIAGNOSIS — D649 Anemia, unspecified: Secondary | ICD-10-CM

## 2016-04-13 MED ORDER — CONCEPT OB 130-92.4-1 MG PO CAPS: 1.0000 | ORAL_CAPSULE | Freq: Every day | ORAL | 12 refills | Status: DC

## 2016-04-13 MED ORDER — TERCONAZOLE 0.4 % VA CREA: 1.0000 | TOPICAL_CREAM | Freq: Every day | VAGINAL | 0 refills | Status: DC

## 2016-04-13 MED ORDER — METRONIDAZOLE 500 MG PO TABS: 500.0000 mg | ORAL_TABLET | Freq: Two times a day (BID) | ORAL | 0 refills | Status: DC

## 2016-04-13 MED ORDER — FAMOTIDINE 20 MG PO TABS: 20.0000 mg | ORAL_TABLET | Freq: Two times a day (BID) | ORAL | 3 refills | Status: DC

## 2016-04-13 NOTE — Patient Instructions (Signed)
High-Fiber Diet Fiber, also called dietary fiber, is a type of carbohydrate found in fruits, vegetables, whole grains, and beans. A high-fiber diet can have many health benefits. Your health care provider may recommend a high-fiber diet to help:  Prevent constipation. Fiber can make your bowel movements more regular.  Lower your cholesterol.  Relieve hemorrhoids, uncomplicated diverticulosis, or irritable bowel syndrome.  Prevent overeating as part of a weight-loss plan.  Prevent heart disease, type 2 diabetes, and certain cancers. What is my plan? The recommended daily intake of fiber includes:  38 grams for men under age 66.  42 grams for men over age 84.  36 grams for women under age 46.  15 grams for women over age 15. You can get the recommended daily intake of dietary fiber by eating a variety of fruits, vegetables, grains, and beans. Your health care provider may also recommend a fiber supplement if it is not possible to get enough fiber through your diet. What do I need to know about a high-fiber diet?  Fiber supplements have not been widely studied for their effectiveness, so it is better to get fiber through food sources.  Always check the fiber content on thenutrition facts label of any prepackaged food. Look for foods that contain at least 5 grams of fiber per serving.  Ask your dietitian if you have questions about specific foods that are related to your condition, especially if those foods are not listed in the following section.  Increase your daily fiber consumption gradually. Increasing your intake of dietary fiber too quickly may cause bloating, cramping, or gas.  Drink plenty of water. Water helps you to digest fiber. What foods can I eat? Grains  Whole-grain breads. Multigrain cereal. Oats and oatmeal. Brown rice. Barley. Bulgur wheat. [place]. Bran muffins. Popcorn. Rye wafer crackers. Vegetables  Sweet potatoes. Spinach. Kale. Artichokes. Cabbage. Broccoli.  Green peas. Carrots. Squash. Fruits  Berries. Pears. Apples. Oranges. Avocados. Prunes and raisins. Dried figs. Meats and Other Protein Sources  [place], kidney, pinto, and soy beans. Split peas. Lentils. Nuts and seeds. Dairy  Fiber-fortified yogurt. Beverages  Fiber-fortified soy milk. Fiber-fortified orange juice. Other  Fiber bars. The items listed above may not be a complete list of recommended foods or beverages. Contact your dietitian for more options.  What foods are not recommended? Grains  White bread. Pasta made with refined flour. White rice. Vegetables  Fried potatoes. Canned vegetables. Well-cooked vegetables. Fruits  Fruit juice. Cooked, strained fruit. Meats and Other Protein Sources  Fatty cuts of meat. Fried Sales executive or fried fish. Dairy  Milk. Yogurt. Cream cheese. Sour cream. Beverages  Soft drinks. Other  Cakes and pastries. Butter and oils. The items listed above may not be a complete list of foods and beverages to avoid. Contact your dietitian for more information.  What are some tips for including high-fiber foods in my diet?  Eat a wide variety of high-fiber foods.  Make sure that half of all grains consumed each day are whole grains.  Replace breads and cereals made from refined flour or white flour with whole-grain breads and cereals.  Replace white rice with brown rice, bulgur wheat, or millet.  Start the day with a breakfast that is high in fiber, such as a cereal that contains at least 5 grams of fiber per serving.  Use beans in place of meat in soups, salads, or pasta.  Eat high-fiber snacks, such as berries, raw vegetables, nuts, or popcorn. This information is not intended to replace  advice given to you by your health care provider. Make sure you discuss any questions you have with your health care provider. Document Released: 05/11/2005 Document Revised: 10/17/2015 Document Reviewed: 10/24/2013 Elsevier Interactive Patient Education  2017  [place].   Iron-Rich Diet Introduction Iron is a mineral that helps your body to produce hemoglobin. Hemoglobin is a protein in your red blood cells that carries oxygen to your body's tissues. Eating too little iron may cause you to feel weak and tired, and it can increase your risk for infection. Eating enough iron is necessary for your body's metabolism, muscle function, and nervous system. Iron is naturally found in many foods. It can also be added to foods or fortified in foods. There are two types of dietary iron:  Heme iron. Heme iron is absorbed by the body more easily than nonheme iron. Heme iron is found in meat, poultry, and fish.  Nonheme iron. Nonheme iron is found in dietary supplements, iron-fortified grains, beans, and vegetables. You may need to follow an iron-rich diet if:  You have been diagnosed with iron deficiency or iron-deficiency anemia.  You have a condition that prevents you from absorbing dietary iron, such as:  Infection in your intestines.  Celiac disease. This involves long-lasting (chronic) inflammation of your intestines.  You do not eat enough iron.  You eat a diet that is high in foods that impair iron absorption.  You have lost a lot of blood.  You have heavy bleeding during your menstrual cycle.  You are pregnant. What is my plan? Your health care provider may help you to determine how much iron you need per day based on your condition. Generally, when a person consumes sufficient amounts of iron in the diet, the following iron needs are met:  Men.  83-90 years old: 11 mg per day.  74-43 years old: 8 mg per day.  Women.  49-56 years old: 15 mg per day.  35-65 years old: 18 mg per day.  Over 69 years old: 8 mg per day.  Pregnant women: 27 mg per day.  Breastfeeding women: 9 mg per day. What do I need to know about an iron-rich diet?  Eat fresh fruits and vegetables that are high in vitamin C along with foods that are high in  iron. This will help increase the amount of iron that your body absorbs from food, especially with foods containing nonheme iron. Foods that are high in vitamin C include oranges, peppers, tomatoes, and mango.  Take iron supplements only as directed by your health care provider. Overdose of iron can be life-threatening. If you were prescribed iron supplements, take them with orange juice or a vitamin C supplement.  Cook foods in pots and pans that are made from iron.  Eat nonheme iron-containing foods alongside foods that are high in heme iron. This helps to improve your iron absorption.  Certain foods and drinks contain compounds that impair iron absorption. Avoid eating these foods in the same meal as iron-rich foods or with iron supplements. These include:  Coffee, black tea, and red wine.  Milk, dairy products, and foods that are high in calcium.  Beans, soybeans, and peas.  Whole grains.  When eating foods that contain both nonheme iron and compounds that impair iron absorption, follow these tips to absorb iron better.  Soak beans overnight before cooking.  Soak whole grains overnight and drain them before using.  Ferment flours before baking, such as using yeast in bread dough. What foods can  I eat? Grains  Iron-fortified breakfast cereal. Iron-fortified whole-wheat bread. Enriched rice. Sprouted grains. Vegetables  Spinach. Potatoes with skin. Green peas. Broccoli. Red and green bell peppers. Fermented vegetables. Fruits  Prunes. Raisins. Oranges. Strawberries. Mango. Grapefruit. Meats and Other Protein Sources  Beef liver. Oysters. Beef. Shrimp. [place]. Chicken. [place]. Sardines. Chickpeas. Nuts. Tofu. Beverages  Tomato juice. Fresh orange juice. Prune juice. Hibiscus tea. Fortified instant breakfast shakes. Condiments  Tahini. Fermented soy sauce. Sweets and Desserts  Black-strap molasses. Other  Wheat germ. The items listed above may not be a complete list of  recommended foods or beverages. Contact your dietitian for more options.  What foods are not recommended? Grains  Whole grains. Bran cereal. Bran flour. Oats. Vegetables  Artichokes. Brussels sprouts. Kale. Fruits  Blueberries. Raspberries. Strawberries. Figs. Meats and Other Protein Sources  Soybeans. Products made from soy protein. Dairy  Milk. Cream. Cheese. Yogurt. Cottage cheese. Beverages  Coffee. Black tea. Red wine. Sweets and Desserts  Cocoa. Chocolate. Ice cream. Other  Basil. Oregano. Parsley. The items listed above may not be a complete list of foods and beverages to avoid. Contact your dietitian for more information.  This information is not intended to replace advice given to you by your health care provider. Make sure you discuss any questions you have with your health care provider. Document Released: 12/23/2004 Document Revised: 11/29/2015 Document Reviewed: 12/06/2013  2017 Elsevier

## 2016-04-17 NOTE — Progress Notes (Signed)
   PRENATAL VISIT NOTE  Subjective:  Joann Perez is a 20 y.o. G1P0 at 6943w3d being seen today for ongoing prenatal care.  She is currently monitored for the following issues for this high-risk pregnancy and has Supervision of normal first pregnancy, antepartum; Asymptomatic bacteriuria during pregnancy; Anxiety and depression; and History of drug abuse on her problem list.  Patient reports recurrence of BV (vaginal discharge w/ odor). Sx resolve after Tx, but keep returning. She also reports getting yeast infections after Flagyl. Also C/O heartburn and constipation.  Contractions: Not present. Vag. Bleeding: None.  Movement: Present. Denies leaking of fluid.   The following portions of the patient's history were reviewed and updated as appropriate: allergies, current medications, past family history, past medical history, past social history, past surgical history and problem list. Problem list updated.  Objective:   Vitals:   04/13/16 1034  BP: 123/70  Pulse: 87  Weight: 195 lb (88.5 kg)    Fetal Status: Fetal Heart Rate (bpm): 146 Fundal Height: 30 cm Movement: Present  Presentation: Vertex  General:  Alert, oriented and cooperative. Patient is in no acute distress.  Skin: Skin is warm and dry. No rash noted.   Cardiovascular: Normal heart rate noted  Respiratory: Normal respiratory effort, no problems with respiration noted  Abdomen: Soft, gravid, appropriate for gestational age. Pain/Pressure: Present     Pelvic:  Cervical exam deferred        Extremities: Normal range of motion.  Edema: Trace  Mental Status: Normal mood and affect. Normal behavior. Normal judgment and thought content.   Assessment and Plan:  Pregnancy: G1P0 at 3064w0d  1. Supervision of normal first pregnancy, antepartum  - Prenat w/o A Vit-FeFum-FePo-FA (CONCEPT OB) 130-92.4-1 MG CAPS; Take 1 tablet by mouth daily.  Dispense: 30 capsule; Refill: 12  2. BV (bacterial vaginosis)  - metroNIDAZOLE (FLAGYL) 500  MG tablet; Take 1 tablet (500 mg total) by mouth 2 (two) times daily.  Dispense: 14 tablet; Refill: 0 - terconazole (TERAZOL 7) 0.4 % vaginal cream; Place 1 applicator vaginally at bedtime.  Dispense: 45 g; Refill: 0  3. Gastroesophageal reflux during pregnancy, antepartum  - famotidine (PEPCID) 20 MG tablet; Take 1 tablet (20 mg total) by mouth 2 (two) times daily.  Dispense: 60 tablet; Refill: 3  4. Anemia during pregnancy in third trimester  - Prenat w/o A Vit-FeFum-FePo-FA (CONCEPT OB) 130-92.4-1 MG CAPS; Take 1 tablet by mouth daily.  Dispense: 30 capsule; Refill: 12  5. Constipation during pregnancy in third trimester   Preterm labor symptoms and general obstetric precautions including but not limited to vaginal bleeding, contractions, leaking of fluid and fetal movement were reviewed in detail with the patient. Please refer to After Visit Summary for other counseling recommendations.  Return in about 2 weeks (around 04/27/2016).   Dorathy KinsmanVirginia Dorothie Wah, CNM

## 2016-04-23 ENCOUNTER — Ambulatory Visit (INDEPENDENT_AMBULATORY_CARE_PROVIDER_SITE_OTHER): Payer: Medicaid Other | Admitting: Obstetrics & Gynecology

## 2016-04-23 VITALS — BP 103/64 | HR 75 | Wt 202.0 lb

## 2016-04-23 DIAGNOSIS — Z3400 Encounter for supervision of normal first pregnancy, unspecified trimester: Secondary | ICD-10-CM

## 2016-04-23 DIAGNOSIS — N898 Other specified noninflammatory disorders of vagina: Secondary | ICD-10-CM

## 2016-04-23 DIAGNOSIS — R350 Frequency of micturition: Secondary | ICD-10-CM

## 2016-04-23 DIAGNOSIS — O26893 Other specified pregnancy related conditions, third trimester: Secondary | ICD-10-CM

## 2016-04-23 NOTE — Progress Notes (Signed)
   PRENATAL VISIT NOTE  Subjective:  Joann Perez is a 20 y.o. G1P0 at 6187w6d being seen today for ongoing prenatal care.  She is currently monitored for the following issues for this low-risk pregnancy and has Supervision of normal first pregnancy, antepartum; Asymptomatic bacteriuria during pregnancy; Anxiety and depression; and History of drug abuse on her problem list.  Patient reports She is here today because she has had some intermittent leaking of  fluid from her vagina today, uncertian if it is the BV returned, urine, or amniotic fluid..  Contractions: Not present. Vag. Bleeding: None.  Movement: Present. Denies leaking of fluid.   The following portions of the patient's history were reviewed and updated as appropriate: allergies, current medications, past family history, past medical history, past social history, past surgical history and problem list. Problem list updated.  Objective:   Vitals:   04/23/16 1513  BP: 103/64  Pulse: 75  Weight: 202 lb (91.6 kg)    Fetal Status: Fetal Heart Rate (bpm): 140   Movement: Present     General:  Alert, oriented and cooperative. Patient is in no acute distress.  Skin: Skin is warm and dry. No rash noted.   Cardiovascular: Normal heart rate noted  Respiratory: Normal respiratory effort, no problems with respiration noted  Abdomen: Soft, gravid, appropriate for gestational age. Pain/Pressure: Present     Pelvic:  Cervical exam performed        Extremities: Normal range of motion.  Edema: Trace  Mental Status: Normal mood and affect. Normal behavior. Normal judgment and thought content.  Speculum exam reveals only normal appearing vaginal discharge, Negative for pooling or Valsalva, wet prep sent Bedside u/s shows a normal AFI Assessment and Plan:  Pregnancy: G1P0 at 6787w6d Vaginal discharge- wet prep sent, No evidence of ROM today Reassurance given RTC 2 weeks There are no diagnoses linked to this encounter. Preterm labor symptoms  and general obstetric precautions including but not limited to vaginal bleeding, contractions, leaking of fluid and fetal movement were reviewed in detail with the patient. Please refer to After Visit Summary for other counseling recommendations.  No Follow-up on file.   Joann BossierMyra C Woodford Strege, MD

## 2016-04-24 LAB — WET PREP, GENITAL
CLUE CELLS WET PREP: NONE SEEN
Trich, Wet Prep: NONE SEEN
YEAST WET PREP: NONE SEEN

## 2016-04-25 LAB — CULTURE, URINE COMPREHENSIVE: Organism ID, Bacteria: NO GROWTH

## 2016-04-27 ENCOUNTER — Encounter: Payer: Medicaid Other | Admitting: Advanced Practice Midwife

## 2016-05-11 ENCOUNTER — Ambulatory Visit (INDEPENDENT_AMBULATORY_CARE_PROVIDER_SITE_OTHER): Payer: Medicaid Other | Admitting: Certified Nurse Midwife

## 2016-05-11 VITALS — BP 106/64 | HR 93 | Wt 207.0 lb

## 2016-05-11 DIAGNOSIS — Z3403 Encounter for supervision of normal first pregnancy, third trimester: Secondary | ICD-10-CM

## 2016-05-11 DIAGNOSIS — F1911 Other psychoactive substance abuse, in remission: Secondary | ICD-10-CM

## 2016-05-11 DIAGNOSIS — Z3400 Encounter for supervision of normal first pregnancy, unspecified trimester: Secondary | ICD-10-CM

## 2016-05-11 DIAGNOSIS — O9989 Other specified diseases and conditions complicating pregnancy, childbirth and the puerperium: Secondary | ICD-10-CM

## 2016-05-11 DIAGNOSIS — N898 Other specified noninflammatory disorders of vagina: Secondary | ICD-10-CM

## 2016-05-11 NOTE — Progress Notes (Signed)
Subjective:  Joann Perez is a 20 y.o. G1P0 at 8225w3d being seen today for ongoing prenatal care.  She is currently monitored for the following issues for this low-risk pregnancy and has Supervision of normal first pregnancy, antepartum; Asymptomatic bacteriuria during pregnancy; Anxiety and depression; and History of drug abuse on her problem list.  Patient reports white/yellow malodorous vaginal discharge x2-3 weeks. No itching or irritation. .  Contractions: Not present. Vag. Bleeding: None.  Movement: Present. Denies leaking of fluid.   The following portions of the patient's history were reviewed and updated as appropriate: allergies, current medications, past family history, past medical history, past social history, past surgical history and problem list. Problem list updated.  Objective:   Vitals:   05/11/16 0947  BP: 106/64  Pulse: 93  Weight: 207 lb (93.9 kg)    Fetal Status: Fetal Heart Rate (bpm): 137 Fundal Height: 32 cm Movement: Present  Presentation: Vertex  General:  Alert, oriented and cooperative. Patient is in no acute distress.  Skin: Skin is warm and dry. No rash noted.   Cardiovascular: Normal heart rate noted  Respiratory: Normal respiratory effort, no problems with respiration noted  Abdomen: Soft, gravid, appropriate for gestational age. Pain/Pressure: Present     Pelvic: Vag. Bleeding: None Vag D/C Character: Thin   Cervical exam deferred        Extremities: Normal range of motion.  Edema: Trace  Mental Status: Normal mood and affect. Normal behavior. Normal judgment and thought content.   Urinalysis:      Assessment and Plan:  Pregnancy: G1P0 at 2925w3d  1. Vaginal discharge  - WET PREP FOR TRICH, YEAST, CLUE  2. History of drug abuse - stopped marijuana about 2 weeks ago  3. Supervision of normal first pregnancy, antepartum  Preterm labor symptoms and general obstetric precautions including but not limited to vaginal bleeding, contractions, leaking  of fluid and fetal movement were reviewed in detail with the patient. Please refer to After Visit Summary for other counseling recommendations.  Return in about 2 weeks (around 05/25/2016).   Joann LarryMelanie Ramsie Perez, CNM

## 2016-05-12 ENCOUNTER — Other Ambulatory Visit: Payer: Self-pay

## 2016-05-12 DIAGNOSIS — B9689 Other specified bacterial agents as the cause of diseases classified elsewhere: Secondary | ICD-10-CM

## 2016-05-12 DIAGNOSIS — N760 Acute vaginitis: Principal | ICD-10-CM

## 2016-05-12 LAB — WET PREP FOR TRICH, YEAST, CLUE
Trich, Wet Prep: NONE SEEN
Yeast Wet Prep HPF POC: NONE SEEN

## 2016-05-12 MED ORDER — METRONIDAZOLE 500 MG PO TABS: 500.0000 mg | ORAL_TABLET | Freq: Two times a day (BID) | ORAL | 0 refills | Status: DC

## 2016-05-12 NOTE — Telephone Encounter (Signed)
Spoke with pt, she is aware of lab results (BV) and she is aware that Flagyl has been sent to her pharmacy

## 2016-05-21 ENCOUNTER — Other Ambulatory Visit: Payer: Self-pay

## 2016-05-21 MED ORDER — METRONIDAZOLE 0.75 % VA GEL: 1.0000 | Freq: Two times a day (BID) | VAGINAL | 0 refills | Status: DC

## 2016-05-21 NOTE — Telephone Encounter (Signed)
Pt called and said the Flagyl was not helping to clear up her BV. I spoke with Nicholaus BloomMyra Dove, MD and she told me that it was okay to prescribe Metrogel. Pt is aware that Metrogel is called into her pharmacy.

## 2016-05-25 NOTE — L&D Delivery Note (Signed)
Delivery Note At 5:36 AM a viable female was delivered via Vaginal, Spontaneous Delivery (Presentation:OA).  APGAR: 9, 9; weight  pending.   Placenta status: delivered intact via Tomasa BlaseSchultz.  Cord:  3V with the following complications: none.  Cord pH: n/a  Anesthesia: epidural Episiotomy: None Lacerations: 2nd degree;Perineal, right labial Suture Repair: 2.0 3.0 vicryl rapide Est. Blood Loss (mL): 200  Mom to postpartum.  Baby to Couplet care / Skin to Skin.  Donette LarryMelanie Temekia Caskey, CNM 07/01/2016, 6:00 AM

## 2016-06-01 ENCOUNTER — Encounter: Payer: Self-pay | Admitting: Advanced Practice Midwife

## 2016-06-01 ENCOUNTER — Ambulatory Visit (INDEPENDENT_AMBULATORY_CARE_PROVIDER_SITE_OTHER): Payer: Medicaid Other | Admitting: Advanced Practice Midwife

## 2016-06-01 VITALS — BP 128/79 | HR 98 | Wt 217.0 lb

## 2016-06-01 DIAGNOSIS — O26893 Other specified pregnancy related conditions, third trimester: Secondary | ICD-10-CM

## 2016-06-01 DIAGNOSIS — Z3A36 36 weeks gestation of pregnancy: Secondary | ICD-10-CM

## 2016-06-01 DIAGNOSIS — N898 Other specified noninflammatory disorders of vagina: Secondary | ICD-10-CM

## 2016-06-01 LAB — OB RESULTS CONSOLE GBS: GBS: NEGATIVE

## 2016-06-01 LAB — OB RESULTS CONSOLE GC/CHLAMYDIA
CHLAMYDIA, DNA PROBE: NEGATIVE
Gonorrhea: NEGATIVE

## 2016-06-01 NOTE — Progress Notes (Signed)
Pt was diagnosed with BV and treated. Pt states that she thinks she still has BV ("cottage cheese discharge" and same symptoms)  and also a yeast infection. She was prescribed Terconazole Cream previously for a previous yeast infection. She didn't use it then so she has been using it now for this possible yeast infection.

## 2016-06-02 LAB — GC/CHLAMYDIA PROBE AMP
CT PROBE, AMP APTIMA: NOT DETECTED
GC PROBE AMP APTIMA: NOT DETECTED

## 2016-06-02 LAB — WET PREP BY MOLECULAR PROBE
Candida species: NEGATIVE
Gardnerella vaginalis: NEGATIVE
Trichomonas vaginosis: NEGATIVE

## 2016-06-03 ENCOUNTER — Encounter: Payer: Self-pay | Admitting: Advanced Practice Midwife

## 2016-06-03 NOTE — Patient Instructions (Signed)

## 2016-06-03 NOTE — Progress Notes (Signed)
   PRENATAL VISIT NOTE  Subjective:  Joann Perez is a 21 y.o. G1P0 at 3230w5d being seen today for ongoing prenatal care.  She is currently monitored for the following issues for this low-risk pregnancy and has Supervision of normal first pregnancy, antepartum; Asymptomatic bacteriuria during pregnancy; Anxiety and depression; and History of drug abuse on her problem list.  Patient reports no complaints.  Contractions: Not present. Vag. Bleeding: None.  Movement: Present. Denies leaking of fluid.   The following portions of the patient's history were reviewed and updated as appropriate: allergies, current medications, past family history, past medical history, past social history, past surgical history and problem list. Problem list updated.  Objective:   Vitals:   06/01/16 1034  BP: 128/79  Pulse: 98  Weight: 217 lb (98.4 kg)    Fetal Status:     Movement: Present     General:  Alert, oriented and cooperative. Patient is in no acute distress.  Skin: Skin is warm and dry. No rash noted.   Cardiovascular: Normal heart rate noted  Respiratory: Normal respiratory effort, no problems with respiration noted  Abdomen: Soft, gravid, appropriate for gestational age. Pain/Pressure: Present     Pelvic:  Cervical exam deferred        Extremities: Normal range of motion.  Edema: Trace  Mental Status: Normal mood and affect. Normal behavior. Normal judgment and thought content.   Assessment and Plan:  Pregnancy: G1P0 at 8530w5d  1. [redacted] weeks gestation of pregnancy  - GC/Chlamydia Probe Amp  2. Vaginal discharge during pregnancy in third trimester         May use Terazol - Culture, beta strep (group b only) - WET PREP BY MOLECULAR PROBE  Preterm labor symptoms and general obstetric precautions including but not limited to vaginal bleeding, contractions, leaking of fluid and fetal movement were reviewed in detail with the patient. Please refer to After Visit Summary for other counseling  recommendations.  RTC 1 week  Aviva SignsMarie L Koal Eslinger, CNM

## 2016-06-04 LAB — CULTURE, BETA STREP (GROUP B ONLY)

## 2016-06-06 ENCOUNTER — Encounter: Payer: Self-pay | Admitting: Emergency Medicine

## 2016-06-06 ENCOUNTER — Emergency Department (INDEPENDENT_AMBULATORY_CARE_PROVIDER_SITE_OTHER)
Admission: EM | Admit: 2016-06-06 | Discharge: 2016-06-06 | Disposition: A | Discharge status: Home / Self Care | Payer: Medicaid Other | Source: Home / Self Care | Attending: Family Medicine | Admitting: Family Medicine

## 2016-06-06 DIAGNOSIS — L089 Local infection of the skin and subcutaneous tissue, unspecified: Secondary | ICD-10-CM

## 2016-06-06 MED ORDER — MUPIROCIN 2 % EX OINT: TOPICAL_OINTMENT | CUTANEOUS | 0 refills | Status: DC

## 2016-06-06 NOTE — ED Triage Notes (Signed)
Pt c/o possible MRSA on left wrist x 4 days, rash is spreading, no itching.  Pt also c/o dizziness, nausea and decreased energy.

## 2016-06-06 NOTE — ED Provider Notes (Signed)
CSN: [id number]     Arrival date & time 06/06/16  1721 History   First MD Initiated Contact with Patient 06/06/16 1800     Chief Complaint  Patient presents with  . Rash   (Consider location/radiation/quality/duration/timing/severity/associated sxs/prior Treatment) HPI Joann Perez is a 21 y.o. female who is [redacted] weeks pregnant presenting to UC with c/o of possible MRSA on Left wrist that started about 4 days ago. Pt notes she noticed a small rash that has now developed into 2 shallow sores.  Pain is minimal. She has not been evaluated for current rash but notes she saw similar pictures online that were MRSA.  Denies fever or chills but has had mild intermittent dizziness, nausea and decreased energy.  Denies vomiting or diarrhea. No other rashes or sores.    Past Medical History:  Diagnosis Date  . Anxiety   . Chlamydia 08/2015  . Depression    History reviewed. No pertinent surgical history. Family History  Problem Relation Age of Onset  . Diabetes Father   . Stroke Maternal Grandmother   . Diabetes Paternal Grandmother   . Breast cancer Other   . Diabetes Other   . Heart disease Other    Social History  Substance Use Topics  . Smoking status: Former Smoker    Quit date: 01/06/2015  . Smokeless tobacco: Never Used  . Alcohol use No   OB History    Gravida Para Term Preterm AB Living   1             SAB TAB Ectopic Multiple Live Births                 Review of Systems  Constitutional: Negative for chills and fever.  Gastrointestinal: Positive for nausea. Negative for diarrhea and vomiting.  Musculoskeletal: Negative for arthralgias, joint swelling and myalgias.  Skin: Positive for color change, rash and wound. Negative for pallor.  Neurological: Positive for dizziness. Negative for light-headedness and headaches.    Allergies  Zithromax [azithromycin]  Home Medications   Prior to Admission medications   Medication Sig Start Date End Date Taking? Authorizing  Provider  famotidine (PEPCID) 20 MG tablet Take 1 tablet (20 mg total) by mouth 2 (two) times daily. 04/13/16   Dorathy KinsmanVirginia Smith, CNM  metoCLOPramide (REGLAN) 10 MG tablet Take 1 tablet (10 mg total) by mouth 4 (four) times daily -  before meals and at bedtime. 01/13/16   Harolyn RutherfordJennifer I Rasch, NP  metroNIDAZOLE (METROGEL) 0.75 % vaginal gel Place 1 Applicatorful vaginally 2 (two) times daily. Patient not taking: Reported on 06/01/2016 05/21/16   Allie BossierMyra C Dove, MD  mupirocin ointment (BACTROBAN) 2 % Apply to skin 3 times daily for 5 days 06/06/16   Junius FinnerErin O'Malley, PA-C  Prenat w/o A Vit-FeFum-FePo-FA (CONCEPT OB) 130-92.4-1 MG CAPS Take 1 tablet by mouth daily. 04/13/16   Dorathy KinsmanVirginia Smith, CNM  terconazole (TERAZOL 7) 0.4 % vaginal cream Place 1 applicator vaginally at bedtime.    Historical Provider, MD   Meds Ordered and Administered this Visit  Medications - No data to display  BP 121/73 (BP Location: Left Arm)   Pulse 84   Temp 98 F (36.7 C) (Oral)   Ht 5\' 4"  (1.626 m)   Wt 220 lb 4 oz (99.9 kg)   LMP 09/20/2015 (Exact Date)   SpO2 97%   BMI 37.81 kg/m  No data found.   Physical Exam  Constitutional: She is oriented to person, place, and time. She appears well-developed  and well-nourished. No distress.  Pt sitting on exam bed, appears well, NAD.  HENT:  Head: Normocephalic and atraumatic.  Mouth/Throat: Oropharynx is clear and moist.  Eyes: EOM are normal.  Neck: Normal range of motion.  Cardiovascular: Normal rate.   Pulses:      Radial pulses are 2+ on the left side.  Pulmonary/Chest: Effort normal.  Musculoskeletal: Normal range of motion. She exhibits no edema or tenderness.       Hands: Left wrist and hand: no edema or tenderness. Full ROM.  Neurological: She is alert and oriented to person, place, and time.  Skin: Skin is warm and dry. Capillary refill takes less than 2 seconds. Rash noted. She is not diaphoretic. There is erythema.  Dorsum of Left wrist: faint erythematous  papular lesions with two 3mm shallow sores with scant yellowed dried discharge. No induration or fluctuance.   Psychiatric: She has a normal mood and affect. Her behavior is normal.  Nursing note and vitals reviewed.   Urgent Care Course   Clinical Course     Procedures (including critical care time)  Labs Review Labs Reviewed - No data to display  Imaging Review No results found.    MDM   1. Skin infection    Exam c/w mild skin infection of dorsal Left wrist. Due to small area of rash and sores, as well as pt being pregnant, will treat with topical Mupirocin first.   Rx: Mupirocin TID for 5 days, f/u with PCP later this week if not improving, sooner if worsening. Encouraged to stay well hydrated.     Junius Finner, PA-C 06/07/16 1123

## 2016-06-08 ENCOUNTER — Ambulatory Visit (INDEPENDENT_AMBULATORY_CARE_PROVIDER_SITE_OTHER): Payer: Medicaid Other | Admitting: Advanced Practice Midwife

## 2016-06-08 VITALS — BP 113/67 | HR 91 | Wt 218.0 lb

## 2016-06-08 DIAGNOSIS — Z3403 Encounter for supervision of normal first pregnancy, third trimester: Secondary | ICD-10-CM

## 2016-06-08 DIAGNOSIS — Z3400 Encounter for supervision of normal first pregnancy, unspecified trimester: Secondary | ICD-10-CM

## 2016-06-08 DIAGNOSIS — L739 Follicular disorder, unspecified: Secondary | ICD-10-CM

## 2016-06-09 ENCOUNTER — Telehealth: Payer: Self-pay

## 2016-06-09 DIAGNOSIS — L739 Follicular disorder, unspecified: Secondary | ICD-10-CM | POA: Insufficient documentation

## 2016-06-09 NOTE — Progress Notes (Signed)
   PRENATAL VISIT NOTE  Subjective:  Joann Perez is a 21 y.o. G1P0 at 4563w4d being seen today for ongoing prenatal care.  She is currently monitored for the following issues for this low-risk pregnancy and has Supervision of normal first pregnancy, antepartum; Asymptomatic bacteriuria during pregnancy; Anxiety and depression; and History of drug abuse on her problem list.  Patient reports healing lesions on wrist (treated with Mucipiricin cream).  Contractions: Not present. Vag. Bleeding: None.  Movement: Present. Denies leaking of fluid.   The following portions of the patient's history were reviewed and updated as appropriate: allergies, current medications, past family history, past medical history, past social history, past surgical history and problem list. Problem list updated.  Objective:   Vitals:   06/08/16 1112  BP: 113/67  Pulse: 91  Weight: 218 lb (98.9 kg)    Fetal Status: Fetal Heart Rate (bpm): 142   Movement: Present     General:  Alert, oriented and cooperative. Patient is in no acute distress.  Skin: Skin is warm and dry. No rash noted.   Cardiovascular: Normal heart rate noted  Respiratory: Normal respiratory effort, no problems with respiration noted  Abdomen: Soft, gravid, appropriate for gestational age. Pain/Pressure: Present     Pelvic:  Cervical exam deferred        Extremities: Normal range of motion.  Edema: Trace  Mental Status: Normal mood and affect. Normal behavior. Normal judgment and thought content.   Assessment and Plan:  Pregnancy: G1P0 at 463w4d  Probable MRSA on wrist, probably due to wearing of hair elastic bands on wrists Treated with antibiotic cream, healing, dried Pt to follow up with family medicine . Term labor symptoms and general obstetric precautions including but not limited to vaginal bleeding, contractions, leaking of fluid and fetal movement were reviewed in detail with the patient. Please refer to After Visit Summary for other  counseling recommendations.     Aviva SignsMarie L Chenell Lozon, CNM

## 2016-06-09 NOTE — Telephone Encounter (Signed)
Pt stated that rash seems to be progressing.  I instructed her to call her OB/Gyn to find out their course of action to take.

## 2016-06-09 NOTE — Patient Instructions (Signed)

## 2016-06-15 ENCOUNTER — Ambulatory Visit (INDEPENDENT_AMBULATORY_CARE_PROVIDER_SITE_OTHER): Payer: Medicaid Other | Admitting: Advanced Practice Midwife

## 2016-06-15 VITALS — BP 126/72 | HR 105 | Wt 222.0 lb

## 2016-06-15 DIAGNOSIS — Z3403 Encounter for supervision of normal first pregnancy, third trimester: Secondary | ICD-10-CM

## 2016-06-15 DIAGNOSIS — Z3400 Encounter for supervision of normal first pregnancy, unspecified trimester: Secondary | ICD-10-CM

## 2016-06-15 NOTE — Progress Notes (Signed)
   PRENATAL VISIT NOTE  Subjective:  [name] is a 21 y.o. G1P0 at 2585w3d being seen today for ongoing prenatal care.  She is currently monitored for the following issues for this low-risk pregnancy and has Supervision of normal first pregnancy, antepartum; Asymptomatic bacteriuria during pregnancy; Anxiety and depression; History of drug abuse; and Folliculitis on her problem list.  Patient reports occasional contractions.  Contractions: Irregular. Vag. Bleeding: None.  Movement: Present. Denies leaking of fluid.   The following portions of the patient's history were reviewed and updated as appropriate: allergies, current medications, past family history, past medical history, past social history, past surgical history and problem list. Problem list updated.  Objective:   Vitals:   06/15/16 1125  BP: 126/72  Pulse: (!) 105  Weight: 222 lb (100.7 kg)    Fetal Status: Fetal Heart Rate (bpm): 138   Movement: Present  Presentation: Vertex  General:  Alert, oriented and cooperative. Patient is in no acute distress.  Skin: Skin is warm and dry. No rash noted.   Cardiovascular: Normal heart rate noted  Respiratory: Normal respiratory effort, no problems with respiration noted  Abdomen: Soft, gravid, appropriate for gestational age. Pain/Pressure: Present     Pelvic:  Cervical exam performed Dilation: 1 Effacement (%): 70 Station: -2  Extremities: Normal range of motion.  Edema: Trace  Mental Status: Normal mood and affect. Normal behavior. Normal judgment and thought content.   Assessment and Plan:  Pregnancy: G1P0 at 8885w3d  1. Supervision of normal first pregnancy, antepartum   Term labor symptoms and general obstetric precautions including but not limited to vaginal bleeding, contractions, leaking of fluid and fetal movement were reviewed in detail with the patient. Please refer to After Visit Summary for other counseling recommendations.  Return in about 1 week (around  06/22/2016).   Hurshel PartyLisa A Leftwich-Kirby, CNM

## 2016-06-15 NOTE — Patient Instructions (Signed)

## 2016-06-17 ENCOUNTER — Ambulatory Visit (INDEPENDENT_AMBULATORY_CARE_PROVIDER_SITE_OTHER): Payer: Medicaid Other | Admitting: Obstetrics & Gynecology

## 2016-06-17 ENCOUNTER — Other Ambulatory Visit (HOSPITAL_COMMUNITY): Payer: Self-pay | Admitting: Obstetrics & Gynecology

## 2016-06-17 VITALS — BP 114/67 | HR 97 | Wt 222.0 lb

## 2016-06-17 DIAGNOSIS — Z3403 Encounter for supervision of normal first pregnancy, third trimester: Secondary | ICD-10-CM

## 2016-06-17 DIAGNOSIS — T148XXA Other injury of unspecified body region, initial encounter: Secondary | ICD-10-CM

## 2016-06-17 DIAGNOSIS — Z3400 Encounter for supervision of normal first pregnancy, unspecified trimester: Secondary | ICD-10-CM

## 2016-06-17 NOTE — Progress Notes (Signed)
   PRENATAL VISIT NOTE  Subjective:  Joann ChattersKara A Diop is a 21 y.o. G1P0 at 2473w5d being seen today for ongoing prenatal care.  She is currently monitored for the following issues for this low-risk pregnancy and has Supervision of normal first pregnancy, antepartum; Asymptomatic bacteriuria during pregnancy; Anxiety and depression; History of drug abuse; and Folliculitis on her problem list.  Patient reports multiple little sores on her left wrist an done on her right calf, worries that they may be MRSA. She was seen at Urgent Care for this and given topical medications.   Contractions: Irregular. Vag. Bleeding: None.  Movement: Present. Denies leaking of fluid.   The following portions of the patient's history were reviewed and updated as appropriate: allergies, current medications, past family history, past medical history, past social history, past surgical history and problem list. Problem list updated.  Objective:   Vitals:   06/17/16 1011  BP: 114/67  Pulse: 97  Weight: 222 lb (100.7 kg)    Fetal Status:     Movement: Present     General:  Alert, oriented and cooperative. Patient is in no acute distress.  Skin: Skin is warm and dry. No rash noted.   Cardiovascular: Normal heart rate noted  Respiratory: Normal respiratory effort, no problems with respiration noted  Abdomen: Soft, gravid, appropriate for gestational age. Pain/Pressure: Present     Pelvic:  Cervical exam performed        Extremities: Normal range of motion.  Edema: Trace  Mental Status: Normal mood and affect. Normal behavior. Normal judgment and thought content.   Assessment and Plan:  Pregnancy: G1P0 at 5873w5d  1. Supervision of normal first pregnancy, antepartum  - Wound culture  2. Open wound  - Wound culture  Term labor symptoms and general obstetric precautions including but not limited to vaginal bleeding, contractions, leaking of fluid and fetal movement were reviewed in detail with the patient. Please  refer to After Visit Summary for other counseling recommendations.  No Follow-up on file.   Allie BossierMyra C Filbert Craze, MD

## 2016-06-17 NOTE — Progress Notes (Signed)
Pt thinks she has [place]

## 2016-06-20 LAB — WOUND CULTURE
GRAM STAIN: NONE SEEN
GRAM STAIN: NONE SEEN
GRAM STAIN: NONE SEEN
Gram Stain: NONE SEEN
Gram Stain: NONE SEEN
Gram Stain: NONE SEEN
ORGANISM ID, BACTERIA: NO GROWTH

## 2016-06-22 ENCOUNTER — Ambulatory Visit (INDEPENDENT_AMBULATORY_CARE_PROVIDER_SITE_OTHER): Payer: Medicaid Other | Admitting: Advanced Practice Midwife

## 2016-06-22 ENCOUNTER — Encounter: Payer: Self-pay | Admitting: Advanced Practice Midwife

## 2016-06-22 ENCOUNTER — Other Ambulatory Visit: Payer: Self-pay | Admitting: Advanced Practice Midwife

## 2016-06-22 VITALS — BP 131/72 | HR 84 | Wt 224.0 lb

## 2016-06-22 DIAGNOSIS — L739 Follicular disorder, unspecified: Secondary | ICD-10-CM

## 2016-06-22 DIAGNOSIS — F1290 Cannabis use, unspecified, uncomplicated: Secondary | ICD-10-CM

## 2016-06-22 DIAGNOSIS — Z3400 Encounter for supervision of normal first pregnancy, unspecified trimester: Secondary | ICD-10-CM

## 2016-06-22 DIAGNOSIS — Z3403 Encounter for supervision of normal first pregnancy, third trimester: Secondary | ICD-10-CM

## 2016-06-22 NOTE — Progress Notes (Signed)
   PRENATAL VISIT NOTE  Subjective:  Joann Perez is a 21 y.o. G1P0 at 1129w3d being seen today for ongoing prenatal care.  She is currently monitored for the following issues for this low-risk pregnancy and has Supervision of normal first pregnancy, antepartum; Asymptomatic bacteriuria during pregnancy; Anxiety and depression; History of drug abuse; and Folliculitis on her problem list.  Patient reports occasional contractions and lesions on wrist are getting better, but now has new lesion on right thigh and left upper arm.  Using Mupirocin. Lesions are mildly tender. No burning. One on wrist has weapy drainage. Others are still closed. Cultures from last week are neg, but pt states the lesions was not draining at that time. Pt is concerned about passing infection to her baby.   Contractions: Irregular. Vag. Bleeding: None.  Movement: Present. Denies leaking of fluid.   The following portions of the patient's history were reviewed and updated as appropriate: allergies, current medications, past family history, past medical history, past social history, past surgical history and problem list. Problem list updated.  Objective:   Vitals:   06/22/16 0856  BP: 131/72  Pulse: 84  Weight: 224 lb (101.6 kg)    Fetal Status: Fetal Heart Rate (bpm): 152 Fundal Height: 40 cm Movement: Present  Presentation: Vertex  General:  Alert, oriented and cooperative. Patient is in no acute distress.  Skin: Skin is warm and dry. No rash noted.   Cardiovascular: Normal heart rate noted  Respiratory: Normal respiratory effort, no problems with respiration noted  Abdomen: Soft, gravid, appropriate for gestational age. Pain/Pressure: Present     Pelvic:  Cervical exam performed Dilation: 1 Effacement (%): 80 Station: -2. Membranes swept per pt request  Extremities: Normal range of motion.  Edema: Trace  Mental Status: Normal mood and affect. Normal behavior. Normal judgment and thought content.   Assessment and  Plan:  Pregnancy: G1P0 at 3129w3d  1. Marijuana use  - POCT Urine Drug Screen  2. Supervision of normal first pregnancy, antepartum  - Wound culture - Herpes Simplex Virus Culture  3. Folliculitis  - Wound culture - Herpes Simplex Virus Culture - recommend continuing mupirocin and keeping lesions covered, especially after baby is born.   Term labor symptoms and general obstetric precautions including but not limited to vaginal bleeding, contractions, leaking of fluid and fetal movement were reviewed in detail with the patient. Please refer to After Visit Summary for other counseling recommendations.  Return in about 1 week (around 06/29/2016) for ROB/NST.   Dorathy KinsmanVirginia Nikoleta Dady, CNM

## 2016-06-22 NOTE — Progress Notes (Signed)
Wiound cultures neg from last week

## 2016-06-22 NOTE — Patient Instructions (Signed)
Braxton Hicks Contractions °Contractions of the uterus can occur throughout pregnancy. Contractions are not always a sign that you are in labor.  °WHAT ARE BRAXTON HICKS CONTRACTIONS?  °Contractions that occur before labor are called Braxton Hicks contractions, or false labor. Toward the end of pregnancy (32-34 weeks), these contractions can develop more often and may become more forceful. This is not true labor because these contractions do not result in opening (dilatation) and thinning of the cervix. They are sometimes difficult to tell apart from true labor because these contractions can be forceful and people have different pain tolerances. You should not feel embarrassed if you go to the hospital with false labor. Sometimes, the only way to tell if you are in true labor is for your health care provider to look for changes in the cervix. °If there are no prenatal problems or other health problems associated with the pregnancy, it is completely safe to be sent home with false labor and await the onset of true labor. °HOW CAN YOU TELL THE DIFFERENCE BETWEEN TRUE AND FALSE LABOR? °False Labor  °· The contractions of false labor are usually shorter and not as hard as those of true labor.   °· The contractions are usually irregular.   °· The contractions are often felt in the front of the lower abdomen and in the groin.   °· The contractions may go away when you walk around or change positions while lying down.   °· The contractions get weaker and are shorter lasting as time goes on.   °· The contractions do not usually become progressively stronger, regular, and closer together as with true labor.   °True Labor  °· Contractions in true labor last 30-70 seconds, become very regular, usually become more intense, and increase in frequency.   °· The contractions do not go away with walking.   °· The discomfort is usually felt in the top of the uterus and spreads to the lower abdomen and low back.   °· True labor can be  determined by your health care provider with an exam. This will show that the cervix is dilating and getting thinner.   °WHAT TO REMEMBER °· Keep up with your usual exercises and follow other instructions given by your health care provider.   °· Take medicines as directed by your health care provider.   °· Keep your regular prenatal appointments.   °· Eat and drink lightly if you think you are going into labor.   °· If Braxton Hicks contractions are making you uncomfortable:   °¨ Change your position from lying down or resting to walking, or from walking to resting.   °¨ Sit and rest in a tub of warm water.   °¨ Drink 2-3 glasses of water. Dehydration may cause these contractions.   °¨ Do slow and deep breathing several times an hour.   °WHEN SHOULD I SEEK IMMEDIATE MEDICAL CARE? °Seek immediate medical care if: °· Your contractions become stronger, more regular, and closer together.   °· You have fluid leaking or gushing from your vagina.   °· You have a fever.   °· You pass blood-tinged mucus.   °· You have vaginal bleeding.   °· You have continuous abdominal pain.   °· You have low back pain that you never had before.   °· You feel your baby's head pushing down and causing pelvic pressure.   °· Your baby is not moving as much as it used to.   °This information is not intended to replace advice given to you by your health care provider. Make sure you discuss any questions you have with your health care   provider. °Document Released: 05/11/2005 Document Revised: 09/02/2015 Document Reviewed: 02/20/2013 °Elsevier Interactive Patient Education © 2017 [employer] ° °

## 2016-06-24 LAB — HERPES SIMPLEX VIRUS CULTURE: ORGANISM ID, BACTERIA: NOT DETECTED

## 2016-06-25 LAB — WOUND CULTURE
Gram Stain: NONE SEEN
Gram Stain: NONE SEEN
ORGANISM ID, BACTERIA: NORMAL

## 2016-06-26 ENCOUNTER — Ambulatory Visit (INDEPENDENT_AMBULATORY_CARE_PROVIDER_SITE_OTHER): Payer: Medicaid Other | Admitting: Family

## 2016-06-26 ENCOUNTER — Encounter (HOSPITAL_COMMUNITY): Payer: Self-pay | Admitting: *Deleted

## 2016-06-26 ENCOUNTER — Telehealth (HOSPITAL_COMMUNITY): Payer: Self-pay | Admitting: *Deleted

## 2016-06-26 VITALS — BP 123/65 | Wt 225.0 lb

## 2016-06-26 DIAGNOSIS — Z3403 Encounter for supervision of normal first pregnancy, third trimester: Secondary | ICD-10-CM

## 2016-06-26 DIAGNOSIS — Z3400 Encounter for supervision of normal first pregnancy, unspecified trimester: Secondary | ICD-10-CM

## 2016-06-26 LAB — PAIN MGMT, PROFILE 1 W/O CONF, U
AMPHETAMINES: NEGATIVE ng/mL (ref <500)
BARBITURATES: NEGATIVE ng/mL (ref <300)
Benzodiazepines: NEGATIVE ng/mL (ref <100)
COCAINE METABOLITE: NEGATIVE ng/mL (ref <150)
CREATININE: 161.7 mg/dL (ref >=20.0)
Marijuana Metabolite: NEGATIVE ng/mL (ref <20)
Methadone Metabolite: NEGATIVE ng/mL (ref <100)
OXIDANT: NEGATIVE ug/mL (ref <200)
Opiates: NEGATIVE ng/mL (ref <100)
Oxycodone: NEGATIVE ng/mL (ref <100)
PH: 6.83 (ref 4.5–9.0)
PHENCYCLIDINE: NEGATIVE ng/mL (ref <25)

## 2016-06-26 NOTE — Telephone Encounter (Signed)
Preadmission screen  

## 2016-06-26 NOTE — Progress Notes (Signed)
   PRENATAL VISIT NOTE  Subjective:  Joann Perez is a 21 y.o. G1P0 at 1231w0d being seen today for ongoing prenatal care.  She is currently monitored for the following issues for this low-risk pregnancy and has Supervision of normal first pregnancy, antepartum; Asymptomatic bacteriuria during pregnancy; Anxiety and depression; History of drug abuse; and Folliculitis on her problem list.  Perez reports large amount of mucus on tissue yesterday (showed picture).  Denies gush of fluid or leaking of fluid. Contractions: Irregular. Vag. Bleeding: None.  Movement: Present. Denies leaking of fluid.   The following portions of the Perez's history were reviewed and updated as appropriate: allergies, current medications, past family history, past medical history, past social history, past surgical history and problem list. Problem list updated.  Objective:   Vitals:   06/26/16 0939  BP: 123/65  Weight: 225 lb (102.1 kg)    Fetal Status: Fetal Heart Rate (bpm): 127 Fundal Height: 39 cm Movement: Present  Presentation: Vertex  General:  Alert, oriented and cooperative. Perez is in no acute distress.  Skin: Skin is warm and dry. No rash noted.   Cardiovascular: Normal heart rate noted  Respiratory: Normal respiratory effort, no problems with respiration noted  Abdomen: Soft, gravid, appropriate for gestational age. Pain/Pressure: Present     Pelvic:  Cervical exam performed Dilation: 1 Effacement (%): 80 Station: -2; speculum exam thick mucus seen in vagina; no watery discharge noted; fern neg  Extremities: Normal range of motion.  Edema: Trace  Mental Status: Normal mood and affect. Normal behavior. Normal judgment and thought content.   Assessment and Plan:  Pregnancy: G1P0 at 4831w0d  1. Supervision of normal first pregnancy, antepartum - Reviewed normalcy of discharge - Explained signs of labor - NST-R - Scheduled IOL for one week  Term labor symptoms and general obstetric precautions  including but not limited to vaginal bleeding, contractions, leaking of fluid and fetal movement were reviewed in detail with the Perez. Please refer to After Visit Summary for other counseling recommendations.   Joann Perez, CNM

## 2016-06-27 ENCOUNTER — Encounter (HOSPITAL_COMMUNITY): Payer: Self-pay

## 2016-06-27 ENCOUNTER — Inpatient Hospital Stay (HOSPITAL_COMMUNITY)
Admission: AD | Admit: 2016-06-27 | Discharge: 2016-06-27 | Disposition: A | Discharge status: Home / Self Care | Payer: Medicaid Other | Source: Ambulatory Visit | Attending: Obstetrics & Gynecology | Admitting: Obstetrics & Gynecology

## 2016-06-27 DIAGNOSIS — Z87891 Personal history of nicotine dependence: Secondary | ICD-10-CM | POA: Insufficient documentation

## 2016-06-27 DIAGNOSIS — F1911 Other psychoactive substance abuse, in remission: Secondary | ICD-10-CM

## 2016-06-27 DIAGNOSIS — F419 Anxiety disorder, unspecified: Secondary | ICD-10-CM

## 2016-06-27 DIAGNOSIS — Z3A40 40 weeks gestation of pregnancy: Secondary | ICD-10-CM | POA: Insufficient documentation

## 2016-06-27 DIAGNOSIS — N898 Other specified noninflammatory disorders of vagina: Secondary | ICD-10-CM

## 2016-06-27 DIAGNOSIS — F329 Major depressive disorder, single episode, unspecified: Secondary | ICD-10-CM

## 2016-06-27 DIAGNOSIS — F32A Depression, unspecified: Secondary | ICD-10-CM

## 2016-06-27 DIAGNOSIS — F418 Other specified anxiety disorders: Secondary | ICD-10-CM | POA: Diagnosis not present

## 2016-06-27 DIAGNOSIS — O26893 Other specified pregnancy related conditions, third trimester: Secondary | ICD-10-CM | POA: Diagnosis present

## 2016-06-27 DIAGNOSIS — Z3400 Encounter for supervision of normal first pregnancy, unspecified trimester: Secondary | ICD-10-CM

## 2016-06-27 DIAGNOSIS — O99343 Other mental disorders complicating pregnancy, third trimester: Secondary | ICD-10-CM | POA: Insufficient documentation

## 2016-06-27 LAB — POCT FERN TEST: POCT Fern Test: NEGATIVE

## 2016-06-27 MED ORDER — ACETAMINOPHEN 325 MG PO TABS
650.0000 mg | ORAL_TABLET | Freq: Once | ORAL | Status: AC
  Administered 2016-06-27: 650 mg via ORAL
  Filled 2016-06-27: qty 2

## 2016-06-27 NOTE — Discharge Instructions (Signed)

## 2016-06-27 NOTE — MAU Provider Note (Signed)
Chief Complaint:  Rupture of Membranes   First Provider Initiated Contact with Patient 06/27/16 2108      HPI: Joann Perez is a 21 y.o. G1P0 at [redacted]w[redacted]d who presents to maternity admissions reporting LOF.  Patient had intercourse last night, found to have fluid come out afterward, with continual leaking overnight into the morning. Had to change pants today, but otherwise underwear was dry. Noticing intermittent/infrequent contractions. No STIs during the pregnancy. She did complains of a headache all day, but did not take any medication for it, does not have known blood pressure problems this pregnancy. Pain is 4/10. Denies changes in vision, RUQ/epigastric pain, and denies any problems with BP during her pregnancy.   Her grandmother wanted her to come in to be evaluated for ROM.  Denies regular contractions or vaginal bleeding. Good fetal movement.   Pregnancy Course:   Past Medical History: Past Medical History:  Diagnosis Date  . Anxiety   . Chlamydia 08/2015  . Depression     Past obstetric history: OB History  Gravida Para Term Preterm AB Living  1            SAB TAB Ectopic Multiple Live Births               # Outcome Date GA Lbr Len/2nd Weight Sex Delivery Anes PTL Lv  1 Current               Past Surgical History: History reviewed. No pertinent surgical history.   Family History: Family History  Problem Relation Age of Onset  . Diabetes Father   . Stroke Maternal Grandmother   . Hypertension Maternal Grandmother   . Diabetes Paternal Grandmother   . Breast cancer Other   . Diabetes Other   . Heart disease Other   . Hypertension Maternal Grandfather     Social History: Social History  Substance Use Topics  . Smoking status: Former Smoker    Quit date: 01/06/2015  . Smokeless tobacco: Never Used  . Alcohol use No    Allergies:  Allergies  Allergen Reactions  . Zithromax [Azithromycin]     Meds:  Prescriptions Prior to Admission  Medication Sig  Dispense Refill Last Dose  . famotidine (PEPCID) 20 MG tablet Take 1 tablet (20 mg total) by mouth 2 (two) times daily. 60 tablet 3 06/26/2016 at Unknown time  . mupirocin ointment (BACTROBAN) 2 % Apply to skin 3 times daily for 5 days 22 g 0 Past Week at Unknown time  . metoCLOPramide (REGLAN) 10 MG tablet Take 1 tablet (10 mg total) by mouth 4 (four) times daily -  before meals and at bedtime. (Patient not taking: Reported on 06/26/2016) 60 tablet 1 Not Taking  . Prenat w/o A Vit-FeFum-FePo-FA (CONCEPT OB) 130-92.4-1 MG CAPS Take 1 tablet by mouth daily. (Patient not taking: Reported on 06/26/2016) 30 capsule 12 More than a month at Unknown time    I have reviewed patient's Past Medical Hx, Surgical Hx, Family Hx, Social Hx, medications and allergies.   ROS:  A comprehensive ROS was negative except per HPI.    Physical Exam   Patient Vitals for the past 24 hrs:  BP Temp Temp src Pulse Resp SpO2 Height Weight  06/27/16 2125 126/59 97.7 F (36.5 C) Oral 88 16 - - -  06/27/16 2041 120/67 98.1 F (36.7 C) Oral 92 16 96 % - -  06/27/16 2032 - - - - - - 5\' 4"  (1.626 m) 225 lb 12  oz (102.4 kg)   Constitutional: Well-developed, well-nourished female in no acute distress.  Cardiovascular: normal rate Respiratory: normal effort GI: Abd soft, non-tender, gravid appropriate for gestational age. Pos BS x 4 MS: Extremities nontender, no edema, normal ROM Neurologic: Alert and oriented x 4.  GU: Neg CVAT. Pelvic: NEFG, normal physiologic discharge, no blood, NO POOLING, cervix clean. No CMT. SVE: 1.5/70/-3, cephalic    Labs: No results found for this or any previous visit (from the past 24 hour(s)).  Imaging:  No results found.  MAU Course: Tylenol SSE: NO POOLING, normal physiologic discharge FERN NEG  I personally reviewed the patient's NST today, found to be REACTIVE. 135 bpm, mod var, +accels, no decels. CTX: NONE.   MDM: Plan of care reviewed with patient, including labs and tests  ordered and medical treatment. Despite the headache, her BPs are normal, tylenol was given, no other signs/symptoms. Discussed with patient if she does not find resolution of her headache to return to MAU.    Assessment: 1. Vaginal discharge during pregnancy in third trimester   2. Anxiety and depression   3. History of drug abuse   4. Supervision of normal first pregnancy, antepartum     Plan: Discharge home in stable condition.  Labor precautions and fetal kick counts Called L&D and earliest IOL for patient would be Sunday still, Friday and Saturday are booked. NST on Monday    Allergies as of 06/27/2016      Reactions   Zithromax [azithromycin]       Medication List    STOP taking these medications   metoCLOPramide 10 MG tablet Commonly known as:  REGLAN     TAKE these medications   CONCEPT OB 130-92.4-1 MG Caps Take 1 tablet by mouth daily.   famotidine 20 MG tablet Commonly known as:  PEPCID Take 1 tablet (20 mg total) by mouth 2 (two) times daily.   mupirocin ointment 2 % Commonly known as:  BACTROBAN Apply to skin 3 times daily for 5 days       Jen MowElizabeth Rechy Bost, DO OB Fellow [facility] 06/27/2016 9:08 PM

## 2016-06-27 NOTE — MAU Note (Signed)
Sticky watery leaking went through underwear onto bed at 3 am and small amounts through night.  Very little amt today. No contractions.  No bleeding. Baby moving well.

## 2016-06-29 ENCOUNTER — Inpatient Hospital Stay (HOSPITAL_COMMUNITY)
Admission: RE | Admit: 2016-06-29 | Discharge: 2016-07-03 | DRG: 775 | Disposition: A | Discharge status: Home / Self Care | Payer: Medicaid Other | Source: Ambulatory Visit | Attending: Family Medicine | Admitting: Family Medicine

## 2016-06-29 ENCOUNTER — Encounter (HOSPITAL_COMMUNITY): Payer: Self-pay | Admitting: *Deleted

## 2016-06-29 ENCOUNTER — Ambulatory Visit (INDEPENDENT_AMBULATORY_CARE_PROVIDER_SITE_OTHER): Payer: Medicaid Other | Admitting: Advanced Practice Midwife

## 2016-06-29 ENCOUNTER — Encounter: Payer: Self-pay | Admitting: Advanced Practice Midwife

## 2016-06-29 DIAGNOSIS — O26893 Other specified pregnancy related conditions, third trimester: Secondary | ICD-10-CM | POA: Diagnosis present

## 2016-06-29 DIAGNOSIS — Z8249 Family history of ischemic heart disease and other diseases of the circulatory system: Secondary | ICD-10-CM | POA: Diagnosis not present

## 2016-06-29 DIAGNOSIS — Z6791 Unspecified blood type, Rh negative: Secondary | ICD-10-CM

## 2016-06-29 DIAGNOSIS — Z823 Family history of stroke: Secondary | ICD-10-CM | POA: Diagnosis not present

## 2016-06-29 DIAGNOSIS — Z3689 Encounter for other specified antenatal screening: Secondary | ICD-10-CM | POA: Diagnosis not present

## 2016-06-29 DIAGNOSIS — Z3400 Encounter for supervision of normal first pregnancy, unspecified trimester: Secondary | ICD-10-CM

## 2016-06-29 DIAGNOSIS — O99324 Drug use complicating childbirth: Secondary | ICD-10-CM | POA: Diagnosis present

## 2016-06-29 DIAGNOSIS — O4103X0 Oligohydramnios, third trimester, not applicable or unspecified: Principal | ICD-10-CM | POA: Diagnosis present

## 2016-06-29 DIAGNOSIS — Z833 Family history of diabetes mellitus: Secondary | ICD-10-CM

## 2016-06-29 DIAGNOSIS — O99344 Other mental disorders complicating childbirth: Secondary | ICD-10-CM | POA: Diagnosis present

## 2016-06-29 DIAGNOSIS — Z87891 Personal history of nicotine dependence: Secondary | ICD-10-CM

## 2016-06-29 DIAGNOSIS — Z3A40 40 weeks gestation of pregnancy: Secondary | ICD-10-CM

## 2016-06-29 DIAGNOSIS — F1111 Opioid abuse, in remission: Secondary | ICD-10-CM

## 2016-06-29 DIAGNOSIS — F418 Other specified anxiety disorders: Secondary | ICD-10-CM | POA: Diagnosis present

## 2016-06-29 DIAGNOSIS — F419 Anxiety disorder, unspecified: Secondary | ICD-10-CM

## 2016-06-29 DIAGNOSIS — O288 Other abnormal findings on antenatal screening of mother: Secondary | ICD-10-CM

## 2016-06-29 DIAGNOSIS — F329 Major depressive disorder, single episode, unspecified: Secondary | ICD-10-CM

## 2016-06-29 DIAGNOSIS — O4100X0 Oligohydramnios, unspecified trimester, not applicable or unspecified: Secondary | ICD-10-CM

## 2016-06-29 DIAGNOSIS — F1911 Other psychoactive substance abuse, in remission: Secondary | ICD-10-CM

## 2016-06-29 DIAGNOSIS — F32A Depression, unspecified: Secondary | ICD-10-CM

## 2016-06-29 LAB — CBC
HEMATOCRIT: 27.4 % — AB (ref 36.0–46.0)
Hemoglobin: 9 g/dL — ABNORMAL LOW (ref 12.0–15.0)
MCH: 25.4 pg — ABNORMAL LOW (ref 26.0–34.0)
MCHC: 32.8 g/dL (ref 30.0–36.0)
MCV: 77.4 fL — ABNORMAL LOW (ref 78.0–100.0)
Platelets: 229 10*3/uL (ref 150–400)
RBC: 3.54 MIL/uL — ABNORMAL LOW (ref 3.87–5.11)
RDW: 14.3 % (ref 11.5–15.5)
WBC: 9.3 10*3/uL (ref 4.0–10.5)

## 2016-06-29 LAB — TYPE AND SCREEN
ABO/RH(D): B NEG
ANTIBODY SCREEN: NEGATIVE

## 2016-06-29 LAB — ABO/RH: ABO/RH(D): B NEG

## 2016-06-29 MED ORDER — LIDOCAINE HCL (PF) 1 % IJ SOLN
30.0000 mL | INTRAMUSCULAR | Status: DC | PRN
  Filled 2016-06-29: qty 30

## 2016-06-29 MED ORDER — OXYTOCIN 40 UNITS IN LACTATED RINGERS INFUSION - SIMPLE MED
1.0000 m[IU]/min | INTRAVENOUS | Status: DC
  Administered 2016-06-29: 4 m[IU]/min via INTRAVENOUS
  Administered 2016-06-29: 2 m[IU]/min via INTRAVENOUS
  Administered 2016-06-30: 16 m[IU]/min via INTRAVENOUS
  Administered 2016-06-30: 14 m[IU]/min via INTRAVENOUS
  Administered 2016-06-30: 20 m[IU]/min via INTRAVENOUS
  Filled 2016-06-29 (×2): qty 1000

## 2016-06-29 MED ORDER — ONDANSETRON HCL 4 MG/2ML IJ SOLN: 4.0000 mg | Freq: Four times a day (QID) | INTRAMUSCULAR | Status: DC | PRN

## 2016-06-29 MED ORDER — TERBUTALINE SULFATE 1 MG/ML IJ SOLN
0.2500 mg | Freq: Once | INTRAMUSCULAR | Status: DC | PRN
  Filled 2016-06-29: qty 1

## 2016-06-29 MED ORDER — OXYCODONE-ACETAMINOPHEN 5-325 MG PO TABS: 2.0000 | ORAL_TABLET | ORAL | Status: DC | PRN

## 2016-06-29 MED ORDER — OXYTOCIN 40 UNITS IN LACTATED RINGERS INFUSION - SIMPLE MED
2.5000 [IU]/h | INTRAVENOUS | Status: DC
  Administered 2016-07-01: 2.5 [IU]/h via INTRAVENOUS
  Filled 2016-06-29: qty 1000

## 2016-06-29 MED ORDER — FENTANYL CITRATE (PF) 100 MCG/2ML IJ SOLN
100.0000 ug | INTRAMUSCULAR | Status: DC | PRN
  Administered 2016-06-30 (×2): 100 ug via INTRAVENOUS
  Filled 2016-06-29 (×2): qty 2

## 2016-06-29 MED ORDER — FLEET ENEMA 7-19 GM/118ML RE ENEM: 1.0000 | ENEMA | RECTAL | Status: DC | PRN

## 2016-06-29 MED ORDER — LACTATED RINGERS IV SOLN
INTRAVENOUS | Status: DC
  Administered 2016-06-29 – 2016-06-30 (×3): via INTRAVENOUS

## 2016-06-29 MED ORDER — LACTATED RINGERS IV SOLN: 500.0000 mL | INTRAVENOUS | Status: DC | PRN

## 2016-06-29 MED ORDER — MISOPROSTOL 25 MCG QUARTER TABLET
25.0000 ug | ORAL_TABLET | Freq: Once | ORAL | Status: AC
  Administered 2016-06-29: 25 ug via VAGINAL
  Filled 2016-06-29: qty 0.25

## 2016-06-29 MED ORDER — SOD CITRATE-CITRIC ACID 500-334 MG/5ML PO SOLN: 30.0000 mL | ORAL | Status: DC | PRN

## 2016-06-29 MED ORDER — ACETAMINOPHEN 325 MG PO TABS: 650.0000 mg | ORAL_TABLET | ORAL | Status: DC | PRN

## 2016-06-29 MED ORDER — OXYTOCIN BOLUS FROM INFUSION
500.0000 mL | Freq: Once | INTRAVENOUS | Status: AC
  Administered 2016-07-01: 500 mL via INTRAVENOUS

## 2016-06-29 MED ORDER — OXYCODONE-ACETAMINOPHEN 5-325 MG PO TABS: 1.0000 | ORAL_TABLET | ORAL | Status: DC | PRN

## 2016-06-29 NOTE — Progress Notes (Signed)
Patient seen. Doing OK. Had felt strong contractions but they have tapered off. Pitocin at 14. Will continue at this time. Recheck in 4 hours. If no change would consider attempting a cytotec and then restarting pitocin.   Ernestina PennaNicholas Mickie Kozikowski, MD 06/29/2016 2059

## 2016-06-29 NOTE — Patient Instructions (Signed)

## 2016-06-29 NOTE — Progress Notes (Signed)
   PRENATAL VISIT NOTE  Subjective:  Joann Perez is a 21 y.o. G1P0 at 168w3d being seen today for ongoing prenatal care.  She is currently monitored for the following issues for this low-risk pregnancy and has Supervision of normal first pregnancy, antepartum; Asymptomatic bacteriuria during pregnancy; Anxiety and depression; History of drug abuse; Folliculitis; and Oligohydramnios antepartum on her problem list.  Patient reports no complaints.  Contractions: Irregular. Vag. Bleeding: None.  Movement: Present. Denies leaking of fluid.   The following portions of the patient's history were reviewed and updated as appropriate: allergies, current medications, past family history, past medical history, past social history, past surgical history and problem list. Problem list updated.  Objective:   Vitals:   06/29/16 1055  BP: 112/66  Pulse: 100  Weight: 223 lb (101.2 kg)    Fetal Status:     Movement: Present  Presentation: Vertex  General:  Alert, oriented and cooperative. Patient is in no acute distress.  Skin: Skin is warm and dry. No rash noted.   Cardiovascular: Normal heart rate noted  Respiratory: Normal respiratory effort, no problems with respiration noted  Abdomen: Soft, gravid, appropriate for gestational age. Pain/Pressure: Present     Pelvic:  Cervical exam performed        Cervix tight 2/80-90/-3  Extremities: Normal range of motion.  Edema: Trace  Mental Status: Normal mood and affect. Normal behavior. Normal judgment and thought content.   Assessment and Plan:  Pregnancy: G1P0 at 7768w3d  1. Oligohydramnios, antepartum, single or unspecified fetus      AFI 4.2  Term labor symptoms and general obstetric precautions including but not limited to vaginal bleeding, contractions, leaking of fluid and fetal movement were reviewed in detail with the patient. Please refer to After Visit Summary for other counseling recommendations.   Was scheduled for IOL on Sunday Now with  equivocal/nonreactive by criteria NST and low AFI, induction of labor is indicated [facility] CNM notified as was [facility] Favorable Cervix, Bishop Score = 6  Joann Perez, CNM

## 2016-06-29 NOTE — H&P (Signed)
LABOR AND DELIVERY ADMISSION HISTORY AND PHYSICAL NOTE  Joann Perez is a 21 y.o. female G1P0 with IUP at 8589w3d by LMP presenting for non-reactive NST and oligohydramnios [facility]  On cervical exam she was found to be 2 cm and 90% effaced.  Noted to have favorable cervix on exam with bishop score of 6.  She reports positive fetal movement. She denies leakage of fluid vaginal bleeding, headache, vision changes, RUQ abdominal pain or edema.  Prenatal History/Complications: Pt is RH negative s/p Rhogam, non-reactive NST and AFI 4.2 in OB office today  Past Medical History: Past Medical History:  Diagnosis Date  . Anxiety   . Chlamydia 08/2015  . Depression     Past Surgical History: History reviewed. No pertinent surgical history.  Obstetrical History: OB History    Gravida Para Term Preterm AB Living   1             SAB TAB Ectopic Multiple Live Births                  Social History: Social History   Social History  . Marital status: Single    Spouse name: N/A  . Number of children: N/A  . Years of education: N/A   Social History Main Topics  . Smoking status: Former Smoker    Quit date: 01/06/2015  . Smokeless tobacco: Never Used  . Alcohol use No  . Drug use: Yes    Types: Marijuana, Oxycodone, Other-see comments     Comment: Last time pt smoked was 01/03/2016. Four or five times a week. Pt states that she smokes weed to help her sickness and to help her sleep. 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 Pt became cleam from oxycodone and tramadol August 2016.   Marland Kitchen. Sexual activity: Yes    Birth control/ protection: None   Other Topics Concern  . None   Social History Narrative  . None    Family History: Family History  Problem Relation Age of Onset  . Diabetes Father   . Stroke Maternal Grandmother   . Hypertension Maternal Grandmother   . Diabetes Paternal Grandmother   . Breast cancer Other   . Diabetes Other   .  Heart disease Other   . Hypertension Maternal Grandfather     Allergies: Allergies  Allergen Reactions  . Zithromax [Azithromycin]     Prescriptions Prior to Admission  Medication Sig Dispense Refill Last Dose  . famotidine (PEPCID) 20 MG tablet Take 1 tablet (20 mg total) by mouth 2 (two) times daily. 60 tablet 3 Taking  . mupirocin ointment (BACTROBAN) 2 % Apply to skin 3 times daily for 5 days 22 g 0 Taking  . Prenat w/o A Vit-FeFum-FePo-FA (CONCEPT OB) 130-92.4-1 MG CAPS Take 1 tablet by mouth daily. (Patient not taking: Reported on 06/26/2016) 30 capsule 12 Not Taking     Review of Systems   All systems reviewed and negative except as stated in HPI  Height 5\' 4"  (1.626 m), weight 101.6 kg (224 lb), last menstrual period 09/20/2015. General appearance: alert, cooperative, appears stated age and no distress Lungs: clear to auscultation bilaterally Heart: regular rate and rhythm Abdomen: soft, non-tender; bowel sounds normal Extremities: No calf swelling or tenderness Presentation: cephalic Fetal monitoring: 120s bpm, moderate variability, no accels, no decels Uterine activity: no contractions    Prenatal labs: ABO, Rh: B/NEG/-- (08/14 1539) Antibody: NEG (11/06 1419) Rubella: immune (01/06/16) RPR: NON  REAC (11/06 1231)  HBsAg: NEGATIVE (08/14 1539)  HIV: NONREACTIVE (11/06 1231)  GBS: Negative (01/08 0000)  1 hr Glucola: 97 (03/30/16) Genetic screening: Quad negative Anatomy US: nrl female  Prenatal Transfer Tool  Maternal Diabetes: No Genetic Screening: Normal Maternal Ultrasounds/Referrals: Abnormal:  Findings:   Other: AFI 4.2 on [place] 06/29/16 Fetal Ultrasounds or other Referrals:  None Maternal Substance Abuse:  No Significant Maternal Medications:  None Significant Maternal Lab Results: Lab values include: Rh negative s/p Rhogam  No results found for this or any previous visit (from the past 24 hour(s)).  Patient Active Problem List   Diagnosis Date Noted   . Oligohydramnios antepartum 06/29/2016  . Folliculitis 06/09/2016  . Anxiety and depression 03/02/2016  . History of drug abuse 03/02/2016  . Asymptomatic bacteriuria during pregnancy 01/09/2016  . Supervision of normal first pregnancy, antepartum 01/08/2016    Assessment: Joann Perez is a 21 y.o. G1P0 at [redacted]w[redacted]d here for IOL 2/2 non-reactive NST and oligohydramnios with AFI 4.2  #labor: IOL 2/2 non-reactive NST and AFI 4.2, will start pitocin and anticipate SVD #Pain: Epidural planned #FWB: Cat I #ID: GBS neg #MOF: Breast #MOC: IUD #Circ:  N/A  Daniel L. Myrtie Soman, MD PGY-1 06/29/2016 3:47 PM   Midwife attestation: I have seen and examined this patient; I agree with above documentation in the resident's note.   Joann Perez is a 21 y.o. G1P1001 @[redacted]w[redacted]d  here for IOL for nonreactive NST and oligohydramnios.  PE: BP 116/60 (BP Location: Left Arm)   Pulse 77   Temp 99.6 F (37.6 C) (Oral)   Resp 18   Ht 5\' 4"  (1.626 m)   Wt 224 lb (101.6 kg)   LMP 09/20/2015 (Exact Date)   SpO2 100%   Breastfeeding? Unknown   BMI 38.45 kg/m  Gen: calm comfortable, NAD Resp: normal effort, no distress Abd: gravid  ROS, labs, PMH reviewed  Plan: Admit to LD Labor: IOL with Pitocin for favorable cervix/Bishop Score 6 FWB: Category I  ID: GBS neg  Sharen Counter, CNM  07/01/2016, 11:18 AM

## 2016-06-29 NOTE — Progress Notes (Signed)
Patient seen. Doing pitocin up to 22 with minimal contractions on no cervical change. Stop pitocin and give one dose cytotec. Restart pitocin a 10 mu at 4AM. Category 1 tracing.

## 2016-06-29 NOTE — Anesthesia Pain Management Evaluation Note (Signed)
  CRNA Pain Management Visit Note  Patient: Joann Perez, 21 y.o., female  "Hello I am a [facility] of the [facility] at [facility]. We have an anesthesia team available at all times to provide care throughout the hospital, including epidural management and anesthesia for C-section. I don't know your plan for the delivery whether it a natural birth, water birth, IV sedation, nitrous supplementation, doula or epidural, but we want to meet your pain goals."   1.Was your pain managed to your expectations on prior hospitalizations?   No prior hospitalizations  2.What is your expectation for pain management during this hospitalization?     Epidural  3.How can we help you reach that goal? epidural  Record the patient's initial score and the patient's pain goal.   Pain: 0  Pain Goal: 4 [facility] wants you to be able to say your pain was always managed very well.  Joann Perez 06/29/2016

## 2016-06-30 ENCOUNTER — Encounter (HOSPITAL_COMMUNITY): Payer: Self-pay | Admitting: Anesthesiology

## 2016-06-30 ENCOUNTER — Inpatient Hospital Stay (HOSPITAL_COMMUNITY): Payer: Medicaid Other | Admitting: Anesthesiology

## 2016-06-30 DIAGNOSIS — O4100X0 Oligohydramnios, unspecified trimester, not applicable or unspecified: Secondary | ICD-10-CM | POA: Diagnosis present

## 2016-06-30 LAB — RPR: RPR Ser Ql: NONREACTIVE

## 2016-06-30 MED ORDER — LIDOCAINE HCL (PF) 1 % IJ SOLN
INTRAMUSCULAR | Status: DC | PRN
  Administered 2016-06-30 (×2): 7 mL via EPIDURAL

## 2016-06-30 MED ORDER — LACTATED RINGERS IV SOLN
500.0000 mL | Freq: Once | INTRAVENOUS | Status: AC
  Administered 2016-06-30: 500 mL via INTRAVENOUS

## 2016-06-30 MED ORDER — FENTANYL 2.5 MCG/ML BUPIVACAINE 1/10 % EPIDURAL INFUSION (WH - ANES)
14.0000 mL/h | INTRAMUSCULAR | Status: DC | PRN
  Administered 2016-06-30 (×2): 14 mL/h via EPIDURAL
  Filled 2016-06-30 (×3): qty 100

## 2016-06-30 MED ORDER — EPHEDRINE 5 MG/ML INJ
10.0000 mg | INTRAVENOUS | Status: DC | PRN
  Filled 2016-06-30: qty 4

## 2016-06-30 MED ORDER — PHENYLEPHRINE 40 MCG/ML (10ML) SYRINGE FOR IV PUSH (FOR BLOOD PRESSURE SUPPORT)
80.0000 ug | PREFILLED_SYRINGE | INTRAVENOUS | Status: DC | PRN
  Filled 2016-06-30: qty 5

## 2016-06-30 MED ORDER — PHENYLEPHRINE 40 MCG/ML (10ML) SYRINGE FOR IV PUSH (FOR BLOOD PRESSURE SUPPORT)
80.0000 ug | PREFILLED_SYRINGE | INTRAVENOUS | Status: DC | PRN
  Filled 2016-06-30: qty 10
  Filled 2016-06-30: qty 5

## 2016-06-30 MED ORDER — DIPHENHYDRAMINE HCL 50 MG/ML IJ SOLN: 12.5000 mg | INTRAMUSCULAR | Status: DC | PRN

## 2016-06-30 NOTE — Anesthesia Procedure Notes (Signed)
Epidural Patient location during procedure: OB Start time: 06/30/2016 9:10 AM End time: 06/30/2016 9:14 AM  Staffing Anesthesiologist: Leilani AbleHATCHETT, Lyann Hagstrom Performed: anesthesiologist   Preanesthetic Checklist Completed: patient identified, surgical consent, pre-op evaluation, timeout performed, IV checked, risks and benefits discussed and monitors and equipment checked  Epidural Patient position: sitting Prep: site prepped and draped and DuraPrep Patient monitoring: continuous pulse ox and blood pressure Approach: midline Location: L3-L4 Injection technique: LOR air  Needle:  Needle type: Tuohy  Needle gauge: 17 G Needle length: 9 cm and 9 Needle insertion depth: 7 cm Catheter type: closed end flexible Catheter size: 19 Gauge Catheter at skin depth: 12 cm Test dose: negative and Other  Assessment Sensory level: T9 Events: blood not aspirated, injection not painful, no injection resistance, negative IV test and no paresthesia  Additional Notes Reason for block:procedure for pain

## 2016-06-30 NOTE — Anesthesia Preprocedure Evaluation (Signed)
Anesthesia Evaluation  Patient identified by MRN, date of birth, ID band Patient awake    Reviewed: Allergy & Precautions, H&P , NPO status , Patient's Chart, lab work & pertinent test results  Airway Mallampati: II  TM Distance: >3 FB Neck ROM: full    Dental no notable dental hx.    Pulmonary former smoker,    Pulmonary exam normal breath sounds clear to auscultation       Cardiovascular negative cardio ROS Normal cardiovascular exam     Neuro/Psych negative neurological ROS     GI/Hepatic negative GI ROS, Neg liver ROS,   Endo/Other  negative endocrine ROS  Renal/GU negative Renal ROS     Musculoskeletal   Abdominal (+) + obese,   Peds  Hematology negative hematology ROS (+)   Anesthesia Other Findings   Reproductive/Obstetrics (+) Pregnancy                             Anesthesia Physical Anesthesia Plan  ASA: II  Anesthesia Plan: Epidural   Post-op Pain Management:    Induction:   Airway Management Planned:   Additional Equipment:   Intra-op Plan:   Post-operative Plan:   Informed Consent: I have reviewed the patients History and Physical, chart, labs and discussed the procedure including the risks, benefits and alternatives for the proposed anesthesia with the patient or authorized representative who has indicated his/her understanding and acceptance.     Plan Discussed with:   Anesthesia Plan Comments:         Anesthesia Quick Evaluation

## 2016-06-30 NOTE — Progress Notes (Signed)
iol for oligo. Today on 20 of pitocin no cervical change. 4.5/70/-2. NST reactive. Contracting every 1-2 minutes so unable to increase pitocin further. Will stop pitocin and re-start @ 14 in 75 minutes. If up-titration of pit after that fails to induce cervical change, would plan on arom.

## 2016-06-30 NOTE — Progress Notes (Signed)
Labor Progress Note Ward ChattersKara A Woodin is a 21 y.o. G1P0 at 6474w4d presented for IOL for oligo and NRNST.  S:  Comfortable with epidural. Feeling frustrated with IOL process.   O:  BP 131/78   Pulse 70   Temp 98.8 F (37.1 C) (Oral)   Resp 18   Ht 5\' 4"  (1.626 m)   Wt 101.6 kg (224 lb)   LMP 09/20/2015 (Exact Date)   SpO2 100%   BMI 38.45 kg/m  EFM: baseline 125 bpm/ mod variability/ + accels/ no decels  Toco: 2-3 SVE: 6/90/-1, AROM clear small Pitocin: 16 mu/min  A/P: 21 y.o. G1P0 5474w4d  1. Labor: early active 2. FWB: Cat I 3. Pain: epidural Continue Pitocin titration. Anticipate labor progression and SVD.  Donette LarryMelanie Amiria Orrison, CNM 9:39 PM

## 2016-07-01 ENCOUNTER — Encounter (HOSPITAL_COMMUNITY): Payer: Self-pay | Admitting: *Deleted

## 2016-07-01 MED ORDER — SIMETHICONE 80 MG PO CHEW
80.0000 mg | CHEWABLE_TABLET | ORAL | Status: DC | PRN
Start: 2016-07-01 — End: 2016-07-03

## 2016-07-01 MED ORDER — BENZOCAINE-MENTHOL 20-0.5 % EX AERO
1.0000 "application " | INHALATION_SPRAY | CUTANEOUS | Status: DC | PRN
  Filled 2016-07-01: qty 56

## 2016-07-01 MED ORDER — SENNOSIDES-DOCUSATE SODIUM 8.6-50 MG PO TABS
2.0000 | ORAL_TABLET | ORAL | Status: DC
  Administered 2016-07-01: 2 via ORAL
  Filled 2016-07-01 (×2): qty 2

## 2016-07-01 MED ORDER — DIBUCAINE 1 % RE OINT
1.0000 "application " | TOPICAL_OINTMENT | RECTAL | Status: DC | PRN
  Administered 2016-07-01: 1 via RECTAL

## 2016-07-01 MED ORDER — ONDANSETRON HCL 4 MG/2ML IJ SOLN: 4.0000 mg | INTRAMUSCULAR | Status: DC | PRN

## 2016-07-01 MED ORDER — ONDANSETRON HCL 4 MG PO TABS: 4.0000 mg | ORAL_TABLET | ORAL | Status: DC | PRN

## 2016-07-01 MED ORDER — PRENATAL MULTIVITAMIN CH
1.0000 | ORAL_TABLET | Freq: Every day | ORAL | Status: DC
  Administered 2016-07-01 – 2016-07-02 (×2): 1 via ORAL
  Filled 2016-07-01 (×2): qty 1

## 2016-07-01 MED ORDER — COCONUT OIL OIL: 1.0000 "application " | TOPICAL_OIL | Status: DC | PRN

## 2016-07-01 MED ORDER — DIPHENHYDRAMINE HCL 25 MG PO CAPS: 25.0000 mg | ORAL_CAPSULE | Freq: Four times a day (QID) | ORAL | Status: DC | PRN

## 2016-07-01 MED ORDER — ACETAMINOPHEN 325 MG PO TABS
650.0000 mg | ORAL_TABLET | ORAL | Status: DC | PRN
  Administered 2016-07-01 – 2016-07-03 (×3): 650 mg via ORAL
  Filled 2016-07-01 (×3): qty 2

## 2016-07-01 MED ORDER — WITCH HAZEL-GLYCERIN EX PADS: 1.0000 "application " | MEDICATED_PAD | CUTANEOUS | Status: DC | PRN

## 2016-07-01 MED ORDER — IBUPROFEN 600 MG PO TABS
600.0000 mg | ORAL_TABLET | Freq: Four times a day (QID) | ORAL | Status: DC
  Administered 2016-07-01 – 2016-07-03 (×8): 600 mg via ORAL
  Filled 2016-07-01 (×8): qty 1

## 2016-07-01 MED ORDER — TETANUS-DIPHTH-ACELL PERTUSSIS 5-2.5-18.5 LF-MCG/0.5 IM SUSP: 0.5000 mL | Freq: Once | INTRAMUSCULAR | Status: DC

## 2016-07-01 MED ORDER — OXYCODONE HCL 5 MG PO TABS
5.0000 mg | ORAL_TABLET | ORAL | Status: DC | PRN
  Administered 2016-07-01 – 2016-07-02 (×3): 5 mg via ORAL
  Filled 2016-07-01 (×3): qty 1

## 2016-07-01 NOTE — Anesthesia Postprocedure Evaluation (Signed)
Anesthesia Post Note  Patient: Joann Perez  Procedure(s) Performed: * No procedures listed *  Patient location during evaluation: Mother Baby Anesthesia Type: Epidural Level of consciousness: awake, awake and alert and oriented Pain management: pain level controlled Vital Signs Assessment: post-procedure vital signs reviewed and stable Respiratory status: spontaneous breathing Cardiovascular status: stable Postop Assessment: no headache, no backache, epidural receding, patient able to bend at knees, no signs of nausea or vomiting and adequate PO intake Anesthetic complications: no        Last Vitals:  Vitals:   07/01/16 0830 07/01/16 0930  BP: 129/67 116/60  Pulse: 81 77  Resp: 18 18  Temp: 37.1 C 37.6 C    Last Pain:  Vitals:   07/01/16 0930  TempSrc: Oral  PainSc:    Pain Goal:                 Land O'LakesMalinova,Zyron Deeley Hristova

## 2016-07-01 NOTE — Lactation Note (Signed)
This note was copied from a baby's chart. Lactation Consultation Note  Patient Name: Joann Perez [id number]'s Date: 07/01/2016 Reason for consult: Initial assessment  Baby is 8 hours old, and has been to the breast several times. 10 - 20 mins , with the RN assisting, Latch score 8-9. Voided and stooled. Per mom I needed help to latch and mom expressed concern she wasn't getting the whole areola in the baby's mouth.  LC reassured her that she didn't have to.  LC reviewed hand expressing and provided a colostrum container to hand express between feedings. Erect nipples , compressible areolas, and mom able  To hand express easily.  LC encouraged mom to apply the EBM to nipples liberally. Also recommended when the baby is latching to use the breast compressions until swallows, and then intermittent.  [facility] and has not [facility] up.  LC recommended she call WIC and set up appt. And get reconnected in preparation to return back to work at 6 weeks. Mom had several; questions about pumping. LC discussed early pumping if needed for when the milk comes in to protect milk supply and hand expressing vs more serious pumping in preparation to return to work.  LC reviewed the process of preparation for returning back to work and hold off until the 4 th week before introducing a bottle. Per mom already has a Medela bottle/ nipple set .  Mother informed of post-discharge support and given phone number to the lactation department, including services for phone call assistance; out-patient appointments; and breastfeeding support group. List of other breastfeeding resources in the community given in the handout. Encouraged mother to call for problems or concerns related to breastfeeding. Mom brought a hand pump ( Medela ) from home and [facility] showed her how to use it , but they misplaced the handle. LC explained how to use the handle.  Mom may need a hand pump before D/C.    Both mom and dad receptive to teaching. Mom aware to call with feeding cues.        Maternal Data Has patient been taught Hand Expression?: Yes Does the patient have breastfeeding experience prior to this delivery?: No  Feeding Feeding Type: Breast Fed Length of feed: 10 min (per  mom swallows / per mom comfortable )  LATCH Score/Interventions Latch: Grasps breast easily, tongue down, lips flanged, rhythmical sucking.  Audible Swallowing: Spontaneous and intermittent Intervention(s): Skin to skin  Type of Nipple: Everted at rest and after stimulation  Comfort (Breast/Nipple): Soft / non-tender     Hold (Positioning): Assistance needed to correctly position infant at breast and maintain latch. Intervention(s): Breastfeeding basics reviewed  LATCH Score: 9  Lactation Tools Discussed/Used WIC Program: No (per mom last appt, since last Aug. , LC reminded mom to call )   Consult Status Consult Status: Follow-up Date: 07/01/16 Follow-up type: In-patient    [name] 07/01/2016, 1:52 PM

## 2016-07-01 NOTE — Lactation Note (Addendum)
This note was copied from a baby's chart. Lactation Consultation Note  Patient Name: Joann Perez'UToday's Date: 07/01/2016 Reason for consult: Follow-up assessment   2nd LC visit today, mom called with feeding cues. LC assisted mom with latching in football/ right breast / hand express/ large drop of colostrum noted / and baby opened wide and with breast compressions obtained depth. Mom needed assistance to obtain the depth .  Reviewed steps for latching. Mom aware to call if having challenges with latching.     Maternal Data Has patient been taught Hand Expression?: Yes Does the patient have breastfeeding experience prior to this delivery?: No  Feeding Feeding Type: Breast Fed Length of feed:  (multiple swallows, increased compressions )  LATCH Score/Interventions Latch: Grasps breast easily, tongue down, lips flanged, rhythmical sucking.  Audible Swallowing: Spontaneous and intermittent Intervention(s): Skin to skin  Type of Nipple: Everted at rest and after stimulation  Comfort (Breast/Nipple): Soft / non-tender     Hold (Positioning): Assistance needed to correctly position infant at breast and maintain latch. Intervention(s): Breastfeeding basics reviewed;Support Pillows;Position options;Skin to skin  LATCH Score: 9  Lactation Tools Discussed/Used WIC Program: No (per mom last appt, since last Aug. , LC reminded mom to call )   Consult Status Consult Status: Follow-up Date: 07/02/16 Follow-up type: In-patient    Joann Perez 07/01/2016, 3:17 PM

## 2016-07-02 MED ORDER — RHO D IMMUNE GLOBULIN 1500 UNIT/2ML IJ SOSY
300.0000 ug | PREFILLED_SYRINGE | Freq: Once | INTRAMUSCULAR | Status: AC
  Administered 2016-07-02: 300 ug via INTRAMUSCULAR
  Filled 2016-07-02: qty 2

## 2016-07-02 MED ORDER — DOCUSATE SODIUM 100 MG PO CAPS: 100.0000 mg | ORAL_CAPSULE | Freq: Two times a day (BID) | ORAL | 0 refills | Status: DC

## 2016-07-02 MED ORDER — IBUPROFEN 600 MG PO TABS: 600.0000 mg | ORAL_TABLET | Freq: Four times a day (QID) | ORAL | 0 refills | Status: AC | PRN

## 2016-07-02 NOTE — Progress Notes (Signed)
POSTPARTUM PROGRESS NOTE  Post Partum Day 1  Subjective:  Joann Perez is a 21 y.o. G1P1001 7892w5d s/p SVD.  No acute events overnight.  Pt denies problems with ambulating, voiding or po intake.  She denies nausea or vomiting.  Pain is well controlled.  She has had flatus. She has had bowel movement.  Lochia Minimal.   Objective: Blood pressure (!) 118/54, pulse 60, temperature 98 F (36.7 C), resp. rate 18, height 5\' 4"  (1.626 m), weight 101.6 kg (224 lb), last menstrual period 09/20/2015, SpO2 100 %, unknown if currently breastfeeding.  Physical Exam:  General: alert, cooperative and no distress Lochia:normal flow Chest: CTAB Heart: RRR no m/r/g Abdomen: +BS, soft, nontender,  Uterine Fundus: firm DVT Evaluation: No calf swelling or tenderness Extremities: no edema   Recent Labs  06/29/16 1645  HGB 9.0*  HCT 27.4*    Assessment/Plan:  ASSESSMENT: Joann Perez is a 21 y.o. G1P1001 192w5d s/p SVD.  Feeling well, no complaints.  Would prefer to stay for today.   Plan for discharge tomorrow   LOS: 3 days   Renne Muscaaniel L Cashmere Harmes, MD PGY-1 [facility], [facility]  07/02/2016, 8:49 AM

## 2016-07-02 NOTE — Lactation Note (Signed)
This note was copied from a baby's chart. Lactation Consultation Note  Patient Name: Joann Perez WRUEA'VToday's Date: 07/02/2016 Reason for consult: Follow-up assessment Baby at 37 hr of life. Upon entry Dad was holding a sleeping baby. Mom reports bf is going well. She denies breast or nipple pain, voiced no concerns. She declined latch help at this visit and did not seem interested in talking with lactation. Parents are aware of lactation services and support group. Mom will call at next bf for lactation to observe a bf.   Maternal Data    Feeding Length of feed: 10 min  LATCH Score/Interventions                      Lactation Tools Discussed/Used     Consult Status Consult Status: Follow-up Date: 07/03/16 Follow-up type: In-patient    Rulon Eisenmengerlizabeth E Rainer Mounce 07/02/2016, 6:43 PM

## 2016-07-03 DIAGNOSIS — F1111 Opioid abuse, in remission: Secondary | ICD-10-CM

## 2016-07-03 LAB — RH IG WORKUP (INCLUDES ABO/RH)
ABO/RH(D): B NEG
Fetal Screen: NEGATIVE
Gestational Age(Wks): 40.5
UNIT DIVISION: 0

## 2016-07-03 NOTE — Clinical Social Work Maternal (Signed)
CLINICAL SOCIAL WORK MATERNAL/CHILD NOTE  Patient Details  Name: Joann Perez MRN: [id number] Date of Birth: January 03, 1996  Date:  07/03/2016  Clinical Social Worker Initiating Note:  Terri Piedra, [place] Date/ Time Initiated:  07/03/16/0930     Child's Name:  Marcelyn Ditty   Legal Guardian:  Other (Comment) (Parents: Lorraine Lax and Duanne Guess)   Need for Interpreter:  None   Date of Referral:  07/02/16     Reason for Referral:  Other (Comment), Current Substance Use/Substance Use During Pregnancy  (Hx Anx/Dep)   Referral Source:  [facility]   Address:  [address], [place], [place] [address]  Phone number:  [phone]   Household Members:  Significant Other   Natural Supports (not living in the home):  Parent (MOB reports that FOB and both sets of parents are her main support people and that she feels well supported.)   Professional Supports: None (MOB reports that she had a counselor in the past.)   Employment:     Type of Work: FOB is currently looking for work.  MOB works at [employer] in [place].   Education:      Museum/gallery curator Resources:  Medicaid   Other Resources:      Cultural/Religious Considerations Which May Impact Care: None stated.  MOB's facesheet notes religion as Non-Denominational.  Strengths:  Ability to meet basic needs , Pediatrician chosen , Home prepared for child  [facility])   Risk Factors/Current Problems:  Mental Health Concerns    Cognitive State:  Linear Thinking , Insightful , Goal Oriented    Mood/Affect:  Calm , Very sleepy   CSW Assessment: CSW met with parents in MOB's first floor room/134 to offer support and complete assessment due to hx of marijuana use (MOB positive on 03/30/16 for THC), and hx of Anx/Dep, cutting, bulimia.   Parents were pleasant when CSW introduced services and asked to speak with MOB.  Parents agreed, however, were very sleepy during assessment.  They were  pleasant and appropriate, but eliciting much response was somewhat difficult.   Parents report that they have great support in their parents who are local, involved and supportive.  They state they have everything they need for baby at home and are aware of SIDS precautions.  FOB reports that he smokes, but does not smoke in the house.   MOB reports a long hx with anxiety and depression and states she had counseling in the past, but has never been treated with medication.  She does not feel she is struggling with symptoms at this time and does not feel she needs treatment.  She was receptive to education provided by CSW regarding PMADs as well as follow up resources should she have any concerns about her mental health in the future.  MOB stated understanding.  MOB reports no additional mental health concerns at this time. 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  MOB reports that she smoked marijuana "a long time ago."  CSW asked her to estimate last use and she was not able to.  She reports that she has quit.  CSW explained hospital drug screen policy and that baby's UDS is negative.  CSW will monitor CDS result and make CPS report accordingly.  Parents state understanding.  FOB informed CSW that smoking marijuana was the only way MOB could  eat and sleep.  MOB confirmed and asked that CSW look in her medical record to see that she had lost 25 lbs early in pregnancy.  CSW commends MOB for quitting.  CSW asked about hx of opiate use and MOB states, "that was a really long time ago.  That was like when I was 18."  CSW commends her for this as well.  MOB reports that she feels well supported and does not need additional professional support for substance use issues.    CSW Plan/Description:  Information/Referral to [employer] , Patient/Family Education    Terri Piedra  [place], [place] 07/03/2016, 10:07 AM

## 2016-07-03 NOTE — Discharge Instructions (Signed)

## 2016-07-03 NOTE — Progress Notes (Signed)
Review of pt's chart reveals hx of opioid addiction (oxycodone/tramadol).  States she has been clean since August 2016.  Oxycodone d/c'd so as not to trigger cravings/addiction.  Pt had uncomplicated SVD > 36 hours ago, so should not require narcotics to control pain.

## 2016-07-03 NOTE — Discharge Summary (Signed)
OB Discharge Summary     Patient Name: Joann Perez DOB: [birth year] MRN: [id number]  Date of admission: 06/29/2016 Delivering MD: Donette Larry   Date of discharge: 07/03/2016  Admitting diagnosis: 40wks induction  Intrauterine pregnancy: [redacted]w[redacted]d     Secondary diagnosis:  Active Problems:   Non-reactive NST (non-stress test)   Oligohydramnios   Opioid abuse, in remission  Additional problems: none     Discharge diagnosis: Term Pregnancy Delivered                                                                                                Post partum procedures:none  Augmentation: AROM and Pitocin  Complications: None  Hospital course:  Induction of Labor With Vaginal Delivery   21 y.o. yo G1P1001 at [redacted]w[redacted]d was admitted to the hospital 06/29/2016 for induction of labor.  Indication for induction: oligohydramnios/NR NST.  Patient had an uncomplicated labor course as follows: Membrane Rupture Time/Date: 9:25 PM ,06/30/2016   Intrapartum Procedures: Episiotomy: None [1]                                         Lacerations:  2nd degree [3];Perineal [11]  Patient had delivery of a Viable infant.  Information for the patient's newborn:  Joann Perez, Joann Perez [[id number]]  Delivery Method: Vaginal, Spontaneous Delivery (Filed from Delivery Summary)   07/01/2016  Details of delivery can be found in separate delivery note.  Patient had a routine postpartum course. Patient is discharged home 07/03/16.  Physical exam  Vitals:   07/01/16 1909 07/02/16 0559 07/02/16 1826 07/03/16 0527  BP: 110/60 (!) 118/54 (!) 147/66 (!) 119/57  Pulse: 68 60 84 76  Resp: 18 18 18 18   Temp: 97.6 F (36.4 C) 98 F (36.7 C) 98 F (36.7 C) 98.1 F (36.7 C)  TempSrc: Oral  Oral Oral  SpO2:      Weight:      Height:       General: alert, cooperative and no distress Lochia: appropriate Uterine Fundus: firm Incision: N/A DVT Evaluation: No evidence of DVT seen on physical exam. Negative Homan's sign. No  significant calf/ankle edema. Labs: Lab Results  Component Value Date   WBC 9.3 06/29/2016   HGB 9.0 (L) 06/29/2016   HCT 27.4 (L) 06/29/2016   MCV 77.4 (L) 06/29/2016   PLT 229 06/29/2016   No flowsheet data found.  Discharge instruction: per After Visit Summary and "Baby and Me Booklet".  After visit meds:  Allergies as of 07/03/2016      Reactions   Zithromax [azithromycin] Hives      Medication List    STOP taking these medications   CONCEPT OB 130-92.4-1 MG Caps   mupirocin ointment 2 % Commonly known as:  BACTROBAN   TYLENOL PO     TAKE these medications   docusate sodium 100 MG capsule Commonly known as:  COLACE Take 1 capsule (100 mg total) by mouth 2 (two) times daily.   famotidine 20 MG tablet Commonly  known as:  PEPCID Take 1 tablet (20 mg total) by mouth 2 (two) times daily.   ibuprofen 600 MG tablet Commonly known as:  ADVIL,MOTRIN Take 1 tablet (600 mg total) by mouth every 6 (six) hours as needed for mild pain or moderate pain.       Diet: routine diet  Activity: Advance as tolerated. Pelvic rest for 6 weeks.   Outpatient follow up: pp checkup 4-6 weeks Follow up Appt:No future appointments. Follow up Visit:No Follow-up on file.  Postpartum contraception: IUD Mirena  Newborn Data: Live born female  Birth Weight: 7 lb 6 oz (3345 g) APGAR: 9, 9  Baby Feeding: Breast Disposition:home with mother   07/03/2016 Joann Perez,Joann Perez, CNM

## 2016-07-03 NOTE — Lactation Note (Signed)
This note was copied from a baby's chart. Lactation Consultation Note  Patient Name: Joann Perez ONGEX'BToday's Date: 07/03/2016   Baby 53 hrs on discharge.  Weight loss at 6%.  Mom reports baby latching well, working on making sure baby opens widely.  Reviewed basics and importance of sandwiching breast to facilitate a deep latch.  Mom reports hearing swallowing.  Baby asleep, and had just fed for 30 mins.  Reviewed engorgement prevention and treatment.  Encouraged STS and cue based feedings.  Goal of 8-12 feedings per 24 hrs. OP lactation services explained and encouraged to call prn. Ped appt 07/06/16    Johny BlamerSmith, Sumie Remsen E 07/03/2016, 11:09 AM

## 2016-07-05 ENCOUNTER — Encounter (HOSPITAL_COMMUNITY): Payer: Self-pay

## 2016-07-05 ENCOUNTER — Inpatient Hospital Stay (HOSPITAL_COMMUNITY): Admission: RE | Admit: 2016-07-05 | Payer: Medicaid Other | Source: Ambulatory Visit

## 2016-07-16 ENCOUNTER — Telehealth: Payer: Self-pay | Admitting: *Deleted

## 2016-07-16 DIAGNOSIS — B373 Candidiasis of vulva and vagina: Secondary | ICD-10-CM

## 2016-07-16 DIAGNOSIS — B3731 Acute candidiasis of vulva and vagina: Secondary | ICD-10-CM

## 2016-07-16 MED ORDER — FLUCONAZOLE 150 MG PO TABS: ORAL_TABLET | ORAL | 1 refills | Status: DC

## 2016-07-16 NOTE — Telephone Encounter (Signed)
Pt called stating that her newborn has thrush and the pediatrcian suggested that she get an antifugal herslef.  Pt does have vaginal itching and some redness around nipples.  RX for Diflucan was sent to her pharmacy.  She does have Nystatin the the pediatrician told her to put around her nipples.  I explained that she should wash it off before feeding s and reapply.

## 2016-07-16 NOTE — Telephone Encounter (Signed)
RF on Diflucan

## 2016-08-10 ENCOUNTER — Ambulatory Visit (INDEPENDENT_AMBULATORY_CARE_PROVIDER_SITE_OTHER): Payer: Medicaid Other | Admitting: Advanced Practice Midwife

## 2016-08-10 ENCOUNTER — Encounter: Payer: Self-pay | Admitting: Advanced Practice Midwife

## 2016-08-10 VITALS — Ht 66.0 in | Wt 206.0 lb

## 2016-08-10 DIAGNOSIS — B373 Candidiasis of vulva and vagina: Secondary | ICD-10-CM

## 2016-08-10 DIAGNOSIS — Z30014 Encounter for initial prescription of intrauterine contraceptive device: Secondary | ICD-10-CM

## 2016-08-10 DIAGNOSIS — B3731 Acute candidiasis of vulva and vagina: Secondary | ICD-10-CM

## 2016-08-10 MED ORDER — FLUCONAZOLE 150 MG PO TABS: ORAL_TABLET | ORAL | 1 refills | Status: DC

## 2016-08-10 MED ORDER — LEVONORGESTREL 18.6 MCG/DAY IU IUD: INTRAUTERINE_SYSTEM | Freq: Once | INTRAUTERINE | Status: DC

## 2016-08-10 MED ORDER — METRONIDAZOLE 500 MG PO TABS: 500.0000 mg | ORAL_TABLET | Freq: Two times a day (BID) | ORAL | 0 refills | Status: AC

## 2016-08-10 NOTE — Patient Instructions (Signed)

## 2016-08-10 NOTE — Progress Notes (Addendum)
Post Partum Exam  [name] is a 21 y.o. 41P1001 female who presents for a postpartum visit. She is 6 weeks postpartum following a spontaneous vaginal delivery. I have fully reviewed the prenatal and intrapartum course. The delivery was at 40 gestational weeks.  Anesthesia: epidural. Postpartum course has been unremarkable. Baby's course has been unremarkable. Baby is feeding by breast. Bleeding no bleeding. Bowel function is normal. Bladder function is normal. Patient is sexually active. Contraception method is IUD. Postpartum depression screening:neg  The following portions of the patient's history were reviewed and updated as appropriate: allergies, current medications, past family history, past medical history, past social history, past surgical history and problem list.  Review of Systems Pertinent items are noted in HPI.    Objective:  unknown if currently breastfeeding.  General:  alert, cooperative and no distress   Breasts:  inspection negative, no nipple discharge or bleeding, no masses or nodularity palpable  Lungs: clear to auscultation bilaterally  Heart:  regular rate and rhythm, S1, S2 normal, no murmur, click, rub or gallop  Abdomen: soft, non-tender; bowel sounds normal; no masses,  no organomegaly   Vulva:  not evaluated  Vagina: not evaluated  Cervix:  not eval  Corpus: not examined  Adnexa:  not evaluated  Rectal Exam: Not performed.        Assessment:    Normal postpartum exam. Pap smear not done at today's visit.   Plan:   1. Contraception: IUD 2. Had IC yesterday, unprotected.  Will place IUD 2 weeks 3. Follow up in: 2 weeks or as needed.   Joann Perez CNM

## 2016-08-16 ENCOUNTER — Telehealth (HOSPITAL_COMMUNITY): Payer: Self-pay | Admitting: Lactation Services

## 2016-08-16 NOTE — Telephone Encounter (Signed)
Mom called because she went out last night to celebrate with a friend (baby was not with her).  Mom stated her last drink was at midnight; states she woke up with a cold and somewhat "light-headed."  Mom states she is often light-headed with colds but denies any other effects from the alcohol.  Infant received pre-pumped milk while mom was gone.  Mom is now out of pumped milk and wanted to know if it is ok to breastfeed her infant.  Mom stated she feels guilty for drinking so much last night (12 shots) and did not want to harm her baby by breastfeeding if there is still alcohol in her breastmilk.  It has been 12 hrs since her last drink.  Discussed with mom as long as she was not having any effects from the alcohol then it was safe to breastfeed.

## 2016-08-24 ENCOUNTER — Ambulatory Visit (INDEPENDENT_AMBULATORY_CARE_PROVIDER_SITE_OTHER): Payer: Medicaid Other | Admitting: Advanced Practice Midwife

## 2016-08-24 ENCOUNTER — Ambulatory Visit: Payer: Medicaid Other | Admitting: Obstetrics & Gynecology

## 2016-08-24 ENCOUNTER — Encounter: Payer: Self-pay | Admitting: Advanced Practice Midwife

## 2016-08-24 VITALS — BP 116/66 | HR 68 | Ht 67.0 in | Wt 206.0 lb

## 2016-08-24 DIAGNOSIS — Z3202 Encounter for pregnancy test, result negative: Secondary | ICD-10-CM

## 2016-08-24 DIAGNOSIS — Z30018 Encounter for initial prescription of other contraceptives: Secondary | ICD-10-CM

## 2016-08-24 DIAGNOSIS — Z30430 Encounter for insertion of intrauterine contraceptive device: Secondary | ICD-10-CM

## 2016-08-24 DIAGNOSIS — Z975 Presence of (intrauterine) contraceptive device: Secondary | ICD-10-CM

## 2016-08-24 DIAGNOSIS — Z01812 Encounter for preprocedural laboratory examination: Secondary | ICD-10-CM

## 2016-08-24 LAB — POCT URINE PREGNANCY: PREG TEST UR: NEGATIVE

## 2016-08-24 MED ORDER — LEVONORGESTREL 18.6 MCG/DAY IU IUD
INTRAUTERINE_SYSTEM | Freq: Once | INTRAUTERINE | Status: AC
  Administered 2016-08-24: 10:00:00 via INTRAUTERINE

## 2016-08-24 NOTE — Patient Instructions (Signed)

## 2016-08-24 NOTE — Progress Notes (Signed)
   Subjective:    Patient ID: Joann Perez, female    DOB: [birth year], 21 y.o.   MRN: [id number]  Other  This is a new (Desires IUD) problem. The current episode started today. Pertinent negatives include no abdominal pain, chills, fever, nausea or vomiting.   Doing well postpartum Had IC 2 wks ago, so delayed insertion of IUD to today Baby doing well   Review of Systems  Constitutional: Negative for chills and fever.  Eyes: Negative for visual disturbance.  Respiratory: Negative for shortness of breath.   Gastrointestinal: Negative for abdominal pain, constipation, diarrhea, nausea and vomiting.  Genitourinary: Positive for dyspareunia (from stitches). Negative for pelvic pain.       Objective:   Physical Exam  Constitutional: She is oriented to person, place, and time. She appears well-developed and well-nourished. No distress.  HENT:  Head: Normocephalic.  Cardiovascular: Normal rate.   Pulmonary/Chest: Effort normal. No respiratory distress.  Abdominal: Soft. She exhibits no distension. There is no tenderness. There is no rebound and no guarding.  Genitourinary:  Genitourinary Comments: Ext genitalia normal Perineum healed but tender where sutures are  Patient identified, informed consent performed, signed copy in chart, time out was performed.  Urine pregnancy test negative.  Speculum placed in the vagina.  Cervix visualized.  Cleaned with Betadine x 2.  Uterus sounded to 6cm.  Liletta IUD placed per manufacturer's recommendations.  Strings trimmed to 3 cm.   Patient given post procedure instructions and Mirena care card with expiration date.  Patient is asked to check IUD strings periodically and follow up in 4-6 weeks for IUD check.   Musculoskeletal: Normal range of motion.  Neurological: She is alert and oriented to person, place, and time.  Skin: Skin is warm and dry.  Psychiatric: She has a normal mood and affect.          Assessment & Plan:  Condoms for  one week Taught how to check strings Return in one month for striing check

## 2016-09-28 ENCOUNTER — Encounter: Payer: Self-pay | Admitting: Advanced Practice Midwife

## 2016-09-28 ENCOUNTER — Other Ambulatory Visit (HOSPITAL_COMMUNITY)
Admission: RE | Admit: 2016-09-28 | Discharge: 2016-09-28 | Disposition: A | Discharge status: Home / Self Care | Payer: Medicaid Other | Source: Ambulatory Visit | Attending: Advanced Practice Midwife | Admitting: Advanced Practice Midwife

## 2016-09-28 ENCOUNTER — Ambulatory Visit (INDEPENDENT_AMBULATORY_CARE_PROVIDER_SITE_OTHER): Payer: Medicaid Other | Admitting: Advanced Practice Midwife

## 2016-09-28 VITALS — BP 108/65 | HR 65 | Resp 16 | Ht 65.0 in | Wt 204.0 lb

## 2016-09-28 DIAGNOSIS — Z8742 Personal history of other diseases of the female genital tract: Secondary | ICD-10-CM | POA: Diagnosis not present

## 2016-09-28 DIAGNOSIS — N760 Acute vaginitis: Secondary | ICD-10-CM | POA: Diagnosis present

## 2016-09-28 DIAGNOSIS — Z7251 High risk heterosexual behavior: Secondary | ICD-10-CM | POA: Diagnosis not present

## 2016-09-28 DIAGNOSIS — N898 Other specified noninflammatory disorders of vagina: Secondary | ICD-10-CM | POA: Diagnosis not present

## 2016-09-28 DIAGNOSIS — R102 Pelvic and perineal pain: Secondary | ICD-10-CM

## 2016-09-28 DIAGNOSIS — N921 Excessive and frequent menstruation with irregular cycle: Secondary | ICD-10-CM | POA: Diagnosis not present

## 2016-09-28 DIAGNOSIS — Z30431 Encounter for routine checking of intrauterine contraceptive device: Secondary | ICD-10-CM

## 2016-09-28 DIAGNOSIS — Z975 Presence of (intrauterine) contraceptive device: Secondary | ICD-10-CM

## 2016-09-28 DIAGNOSIS — B9689 Other specified bacterial agents as the cause of diseases classified elsewhere: Secondary | ICD-10-CM | POA: Diagnosis present

## 2016-09-28 MED ORDER — BORIC ACID CRYS: 600.0000 mg | CRYSTALS | Freq: Every day | 3 refills | Status: AC

## 2016-09-28 MED ORDER — METRONIDAZOLE 500 MG PO TABS: 500.0000 mg | ORAL_TABLET | Freq: Two times a day (BID) | ORAL | 0 refills | Status: AC

## 2016-09-28 NOTE — Progress Notes (Signed)
Subjective:     Patient ID: Joann Perez, female   DOB: 07/02/1995, 21 y.o.   MRN: [id number]  HPI; Pt is here for IUD strong check. IUD placed 08/24/16. Had mod to heavy bleeding x several weeks, much lighter now. Reports pelvic pain since 7 months gestation (~12/17). Wants all STD testing because she is worried abou PID. Also C/O return of BV Sx--heavy, grey, malodorous discharge. Very troubled by recurrent BV.    Review of Systems  Constitutional: Negative for chills, fatigue and fever.  Gastrointestinal: Negative for abdominal distention, abdominal pain, constipation, diarrhea, nausea and vomiting.  Genitourinary: Positive for menstrual problem, pelvic pain, vaginal bleeding and vaginal discharge. Negative for difficulty urinating, dyspareunia, dysuria, frequency, hematuria, urgency and vaginal pain.  Neurological: Negative for dizziness and light-headedness.       Objective:   Physical Exam  Constitutional: She is oriented to person, place, and time. She appears well-developed and well-nourished. No distress.  Cardiovascular: Normal rate.   Pulmonary/Chest: Effort normal. No respiratory distress.  Abdominal: Soft. She exhibits no distension. There is no tenderness.  Genitourinary: There is no tenderness or lesion on the right labia. There is no tenderness or lesion on the left labia. Uterus is not enlarged and not tender. Cervix exhibits discharge (mucus). Cervix exhibits no motion tenderness and no friability. Right adnexum displays no mass and no tenderness. Left adnexum displays no mass and no tenderness. There is tenderness and bleeding (scant) in the vagina. No erythema in the vagina. Vaginal discharge found.  Genitourinary Comments: IUD strings seen, Device NOT protruding through os.   Musculoskeletal: She exhibits no edema.  Lymphadenopathy:       Right: Inguinal adenopathy present.       Left: No inguinal adenopathy present.  Neurological: She is oriented to person, place, and  time.  Skin: Skin is warm and dry. No rash noted.  Psychiatric: She has a normal mood and affect.  Nursing note and vitals reviewed.      Assessment/Plan:     1. BV (bacterial vaginosis)  - metroNIDAZOLE (FLAGYL) 500 MG tablet; Take 1 tablet (500 mg total) by mouth 2 (two) times daily.  Dispense: 14 tablet; Refill: 0 - Boric Acid CRYS; Place 600 mg vaginally at bedtime.  Dispense: 500 g; Refill: 3 - Cervicovaginal ancillary only  2. Vaginal discharge  - metroNIDAZOLE (FLAGYL) 500 MG tablet; Take 1 tablet (500 mg total) by mouth 2 (two) times daily.  Dispense: 14 tablet; Refill: 0 - Boric Acid CRYS; Place 600 mg vaginally at bedtime x 21.  Dispense: 500 g; Refill: 3 - Cervicovaginal ancillary only  3. IUD check up - Strings seen.   4. Breakthrough bleeding with IUD  - Urine Culture - CBC  5. High risk sexual behavior  - HIV antibody (with reflex) - RPR - Hepatitis C Antibody - Hepatitis B Surface AntiGEN - Cervicovaginal ancillary only  6. Pelvic pain in female  Katrinka BlazingSmith, [place], [place] 09/28/2016 12:10 PM

## 2016-09-28 NOTE — Patient Instructions (Signed)
Bacterial Vaginosis Bacterial vaginosis is a vaginal infection that occurs when the normal balance of bacteria in the vagina is disrupted. It results from an overgrowth of certain bacteria. This is the most common vaginal infection among women ages 15-44. Because bacterial vaginosis increases your risk for STIs (sexually transmitted infections), getting treated can help reduce your risk for chlamydia, gonorrhea, herpes, and HIV (human immunodeficiency virus). Treatment is also important for preventing complications in pregnant women, because this condition can cause an early (premature) delivery. What are the causes? This condition is caused by an increase in harmful bacteria that are normally present in small amounts in the vagina. However, the reason that the condition develops is not fully understood. What increases the risk? The following factors may make you more likely to develop this condition:  Having a new sexual partner or multiple sexual partners.  Having unprotected sex.  Douching.  Having an intrauterine device (IUD).  Smoking.  Drug and alcohol abuse.  Taking certain antibiotic medicines.  Being pregnant.  You cannot get bacterial vaginosis from toilet seats, bedding, swimming pools, or contact with objects around you. What are the signs or symptoms? Symptoms of this condition include:  Grey or white vaginal discharge. The discharge can also be watery or foamy.  A fish-like odor with discharge, especially after sexual intercourse or during menstruation.  Itching in and around the vagina.  Burning or pain with urination.  Some women with bacterial vaginosis have no signs or symptoms. How is this diagnosed? This condition is diagnosed based on:  Your medical history.  A physical exam of the vagina.  Testing a sample of vaginal fluid under a microscope to look for a large amount of bad bacteria or abnormal cells. Your health care provider may use a cotton swab  or a small wooden spatula to collect the sample.  How is this treated? This condition is treated with antibiotics. These may be given as a pill, a vaginal cream, or a medicine that is put into the vagina (suppository). If the condition comes back after treatment, a second round of antibiotics may be needed. Follow these instructions at home: Medicines  Take over-the-counter and prescription medicines only as told by your health care provider.  Take or use your antibiotic as told by your health care provider. Do not stop taking or using the antibiotic even if you start to feel better. General instructions  If you have a female sexual partner, tell her that you have a vaginal infection. She should see her health care provider and be treated if she has symptoms. If you have a female sexual partner, he does not need treatment.  During treatment: ? Avoid sexual activity until you finish treatment. ? Do not douche. ? Avoid alcohol as directed by your health care provider. ? Avoid breastfeeding as directed by your health care provider.  Drink enough water and fluids to keep your urine clear or pale yellow.  Keep the area around your vagina and rectum clean. ? Wash the area daily with warm water. ? Wipe yourself from front to back after using the toilet.  Keep all follow-up visits as told by your health care provider. This is important. How is this prevented?  Do not douche.  Wash the outside of your vagina with warm water only.  Use protection when having sex. This includes latex condoms and dental dams.  Limit how many sexual partners you have. To help prevent bacterial vaginosis, it is best to have sex with just   one partner (monogamous).  Make sure you and your sexual partner are tested for STIs.  Wear cotton or cotton-lined underwear.  Avoid wearing tight pants and pantyhose, especially during summer.  Limit the amount of alcohol that you drink.  Do not use any products that  contain nicotine or tobacco, such as cigarettes and e-cigarettes. If you need help quitting, ask your health care provider.  Do not use illegal drugs. Where to find more information:  Centers for Disease Control and Prevention: www.cdc.gov/std  American Sexual Health Association (ASHA): www.ashastd.org  U.S. Department of Health and Human Services, Office on Women's Health: www.womenshealth.gov/ or https://www.womenshealth.gov/a-z-topics/bacterial-vaginosis Contact a health care provider if:  Your symptoms do not improve, even after treatment.  You have more discharge or pain when urinating.  You have a fever.  You have pain in your abdomen.  You have pain during sex.  You have vaginal bleeding between periods. Summary  Bacterial vaginosis is a vaginal infection that occurs when the normal balance of bacteria in the vagina is disrupted.  Because bacterial vaginosis increases your risk for STIs (sexually transmitted infections), getting treated can help reduce your risk for chlamydia, gonorrhea, herpes, and HIV (human immunodeficiency virus). Treatment is also important for preventing complications in pregnant women, because the condition can cause an early (premature) delivery.  This condition is treated with antibiotic medicines. These may be given as a pill, a vaginal cream, or a medicine that is put into the vagina (suppository). This information is not intended to replace advice given to you by your health care provider. Make sure you discuss any questions you have with your health care provider. Document Released: 05/11/2005 Document Revised: 01/25/2016 Document Reviewed: 01/25/2016 Elsevier Interactive Patient Education  2017 Elsevier Inc.  

## 2016-09-29 LAB — HEPATITIS C ANTIBODY: HCV Ab: NEGATIVE

## 2016-09-29 LAB — CBC
HCT: 33.1 % — ABNORMAL LOW (ref 35.0–45.0)
HEMOGLOBIN: 10.1 g/dL — AB (ref 11.7–15.5)
MCH: 24.2 pg — ABNORMAL LOW (ref 27.0–33.0)
MCHC: 30.5 g/dL — ABNORMAL LOW (ref 32.0–36.0)
MCV: 79.4 fL — ABNORMAL LOW (ref 80.0–100.0)
MPV: 9.5 fL (ref 7.5–12.5)
PLATELETS: 236 10*3/uL (ref 140–400)
RBC: 4.17 MIL/uL (ref 3.80–5.10)
RDW: 15.6 % — AB (ref 11.0–15.0)
WBC: 4.3 10*3/uL (ref 3.8–10.8)

## 2016-09-29 LAB — HIV ANTIBODY (ROUTINE TESTING W REFLEX): HIV: NONREACTIVE

## 2016-09-29 LAB — HEPATITIS B SURFACE ANTIGEN: HEP B S AG: NEGATIVE

## 2016-09-29 LAB — RPR

## 2016-09-30 LAB — CERVICOVAGINAL ANCILLARY ONLY
Bacterial vaginitis: NEGATIVE
CANDIDA VAGINITIS: NEGATIVE
Chlamydia: NEGATIVE
NEISSERIA GONORRHEA: NEGATIVE
Trichomonas: NEGATIVE

## 2016-09-30 LAB — URINE CULTURE: Organism ID, Bacteria: NO GROWTH

## 2016-10-07 ENCOUNTER — Ambulatory Visit (INDEPENDENT_AMBULATORY_CARE_PROVIDER_SITE_OTHER): Payer: Medicaid Other | Admitting: Obstetrics & Gynecology

## 2016-10-07 ENCOUNTER — Encounter: Payer: Self-pay | Admitting: Obstetrics & Gynecology

## 2016-10-07 VITALS — BP 96/59 | HR 72 | Ht 67.0 in | Wt 203.0 lb

## 2016-10-07 DIAGNOSIS — Z3202 Encounter for pregnancy test, result negative: Secondary | ICD-10-CM

## 2016-10-07 DIAGNOSIS — Z30430 Encounter for insertion of intrauterine contraceptive device: Secondary | ICD-10-CM

## 2016-10-07 DIAGNOSIS — Z309 Encounter for contraceptive management, unspecified: Secondary | ICD-10-CM

## 2016-10-07 LAB — POCT URINE PREGNANCY: Preg Test, Ur: NEGATIVE

## 2016-10-07 MED ORDER — MEGESTROL ACETATE 40 MG PO TABS
40.0000 mg | ORAL_TABLET | Freq: Two times a day (BID) | ORAL | 5 refills | Status: DC
Start: 2016-10-07 — End: 2019-09-08

## 2016-10-07 MED ORDER — LEVONORGESTREL 18.6 MCG/DAY IU IUD
INTRAUTERINE_SYSTEM | Freq: Once | INTRAUTERINE | Status: AC
  Administered 2016-10-07: 1 via INTRAUTERINE

## 2016-10-07 NOTE — Addendum Note (Signed)
Addended by: Anell BarrHOWARD, Kashis Penley L on: 10/07/2016 10:19 AM   Modules accepted: Orders

## 2016-10-07 NOTE — Progress Notes (Signed)
   Subjective:    Patient ID: Ward ChattersKara A Haapala, female    DOB: 06/21/1995, 21 y.o.   MRN: [id number]  HPI 21yo SW 761 with a 193 month old daughter at home. She is here today because her IUD fell out today. She has been having heavy bleeding for about 3 days. Prior to her delivery, her periods were heavy also. She has had the IUD for about 5 weeks. She pulled out her tampon this morning and noted that the IUD was out also.  We discussed alternative forms of contraception but she would like to try the IUD one more time.   Review of Systems     Objective:   Physical Exam WNWHWFNAD Breathing, conversing, and ambulating normally UPT negative, consent signed, Time out procedure done. Cervix prepped with betadine and grasped with a single tooth tenaculum. Liletta  was easily placed and the strings were cut to 3-4 cm. Uterus sounded to 9 cm. She tolerated the procedure well.      Assessment & Plan:  Contracpeption- Liletta I will give her megace 40 mg BID to use prn heavy bleeding so that hopefully her IUD doesn't come out again. She was concerned about her 1st degree uterine prolapse, so I have rec'd Kegel exercises.

## 2016-11-04 ENCOUNTER — Ambulatory Visit: Payer: Medicaid Other | Admitting: Obstetrics & Gynecology

## 2016-11-09 ENCOUNTER — Ambulatory Visit (INDEPENDENT_AMBULATORY_CARE_PROVIDER_SITE_OTHER): Payer: Medicaid Other | Admitting: Obstetrics & Gynecology

## 2016-11-09 VITALS — BP 124/73 | HR 72 | Wt 197.0 lb

## 2016-11-09 DIAGNOSIS — Z30431 Encounter for routine checking of intrauterine contraceptive device: Secondary | ICD-10-CM

## 2016-11-09 DIAGNOSIS — F419 Anxiety disorder, unspecified: Secondary | ICD-10-CM

## 2016-11-09 DIAGNOSIS — Z0000 Encounter for general adult medical examination without abnormal findings: Secondary | ICD-10-CM

## 2016-11-09 NOTE — Progress Notes (Signed)
   Subjective:    Patient ID: Joann Perez, female    DOB: 06/19/1995, 21 y.o.   MRN: [id number]  HPI 21 yo S P1 here for a string check. She had a Liletta placed about 4 weeks ago. The bleeding has greatly slowed down, she "loves" the IUD, is able to lose weight.  She reports that she is very anxious, saw a counselor in the past, requesting a provider that can prescribe.   Review of Systems Finished breastfeeding about a month ago, milk slowing down    Objective:   Physical Exam WNWHWFNAD Breathing, conversing, and ambulating normally IUD strings seen       Assessment & Plan:  Preventative care- refer to fam med Anxiety- refer to psychiatry

## 2016-12-17 ENCOUNTER — Ambulatory Visit (HOSPITAL_COMMUNITY): Payer: Medicaid Other | Admitting: Psychiatry

## 2017-07-28 IMAGING — US US MFM OB FOLLOW-UP
1 series · 14 of 28 positions shown · non-contrast
Comparison: none

[Series 1: us mfm ob follow-up · 86 acquisitions, 14 frames shown]
[im 4/86]
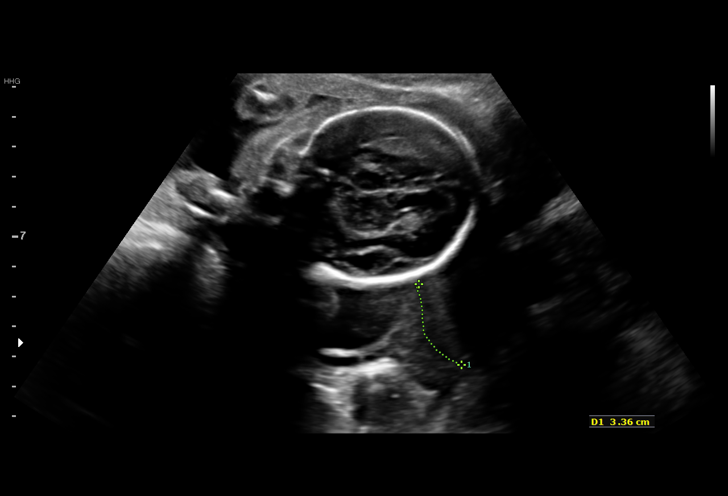
[im 10/86]
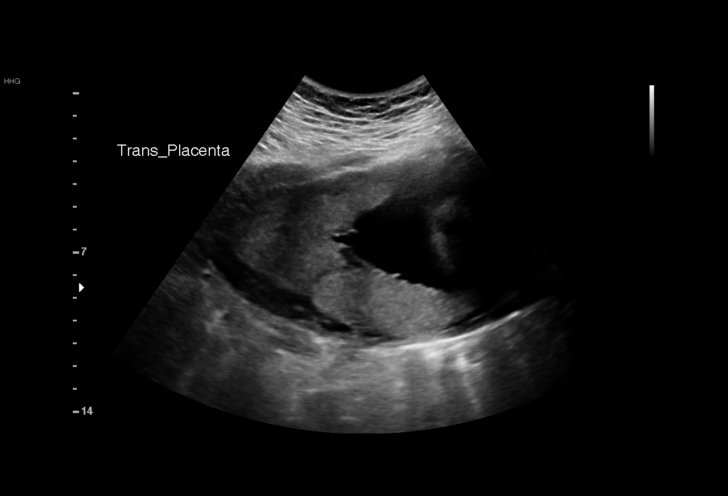
[im 16/86]
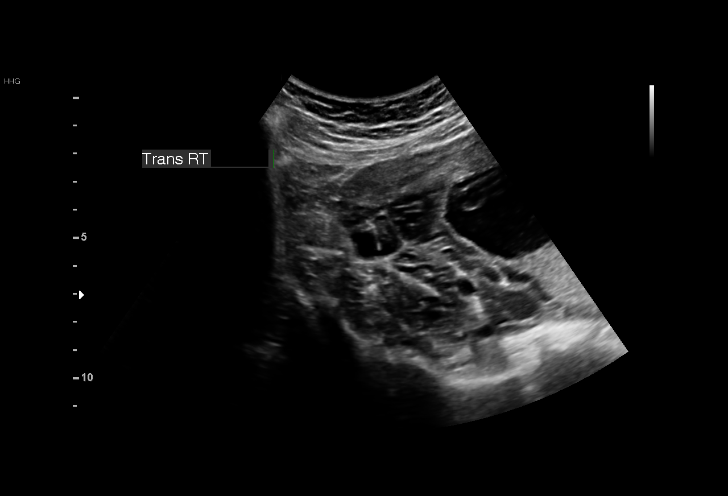
[im 23/86]
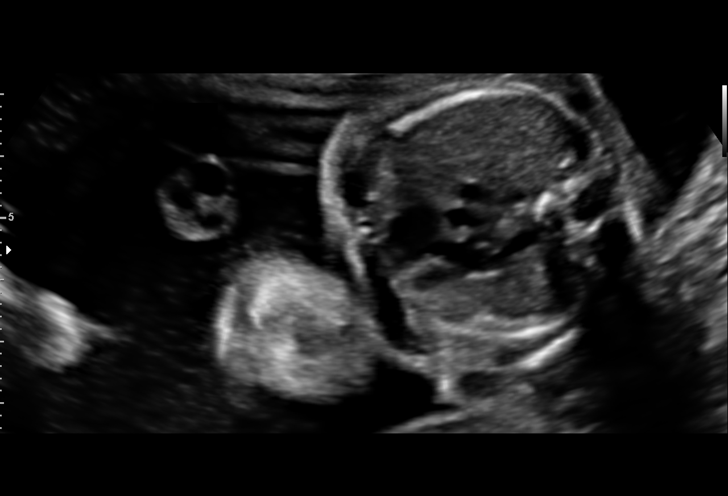
[im 29/86]
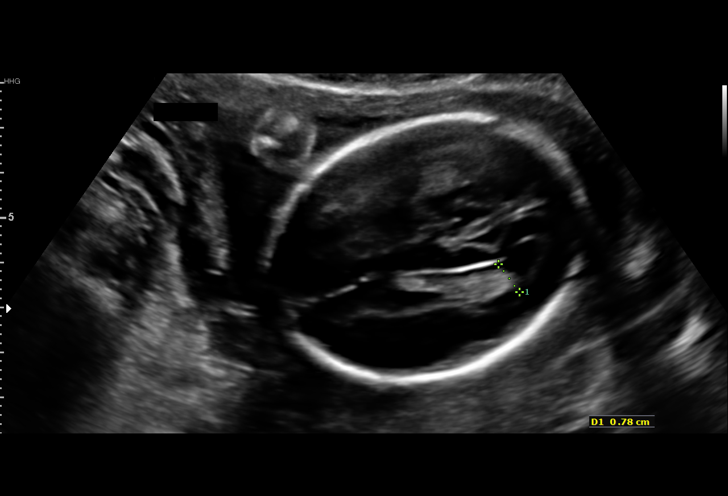
[im 35/86]
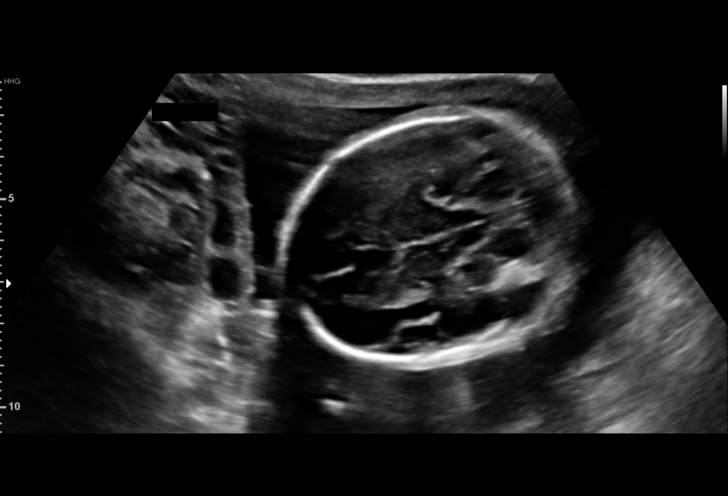
[im 41/86]
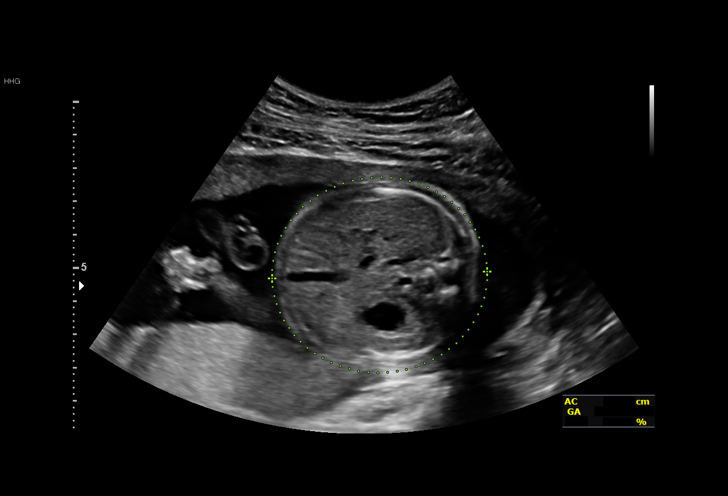
[im 48/86]
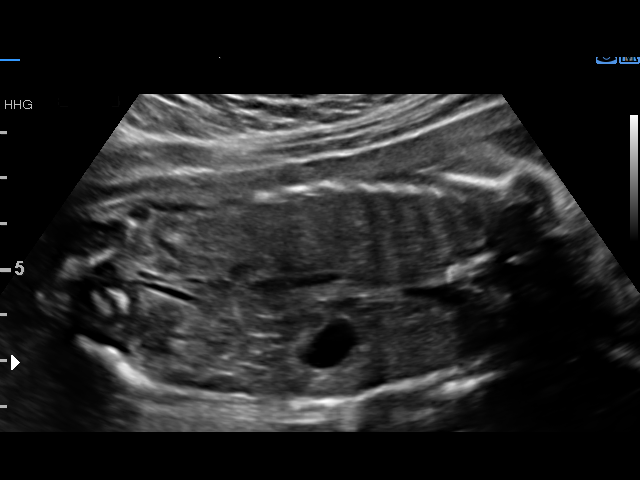
[im 54/86]
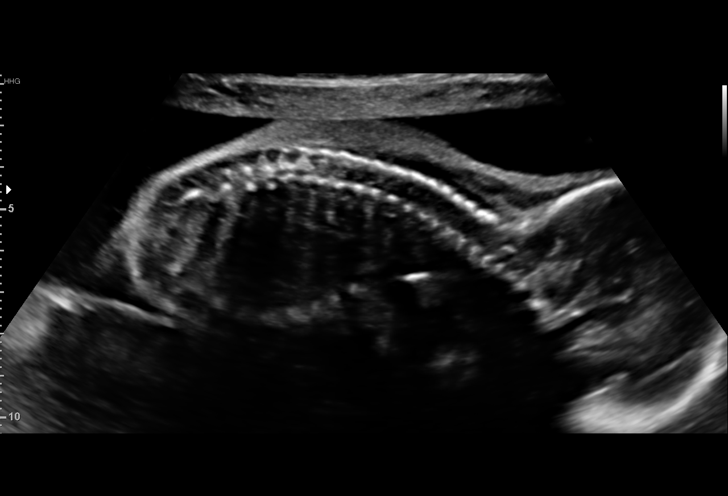
[im 60/86]
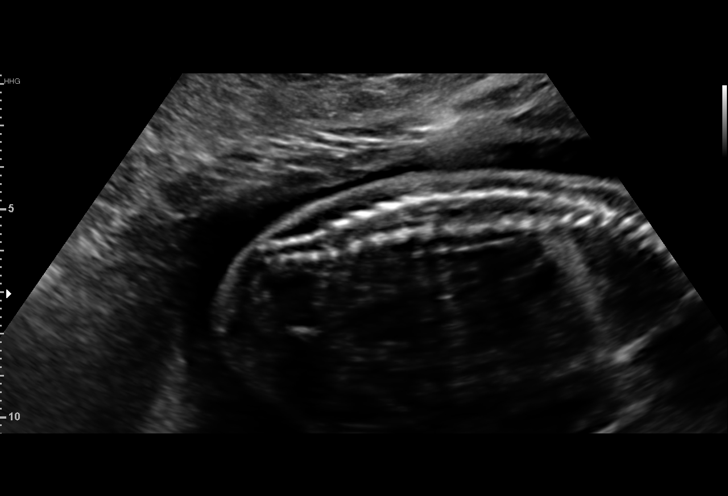
[im 67/86]
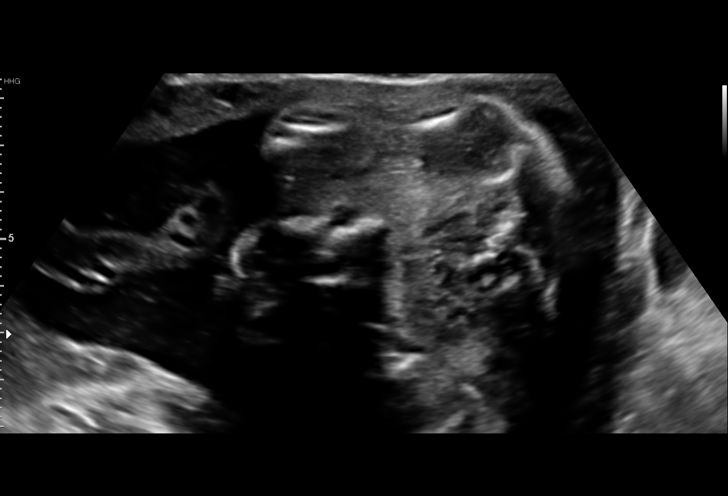
[im 73/86]
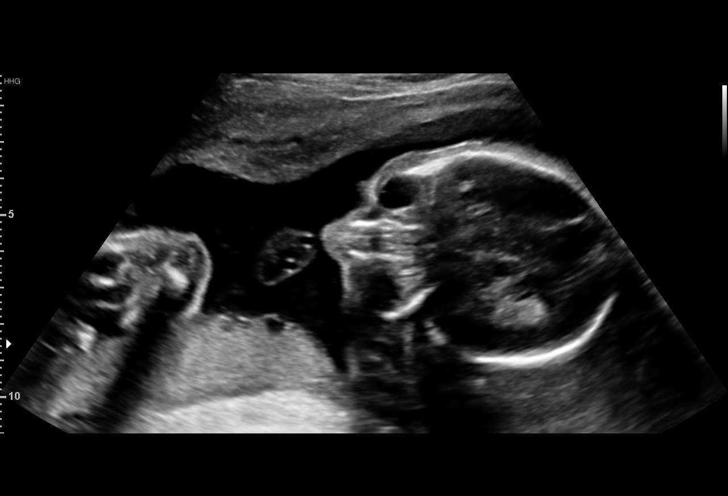
[im 79/86]
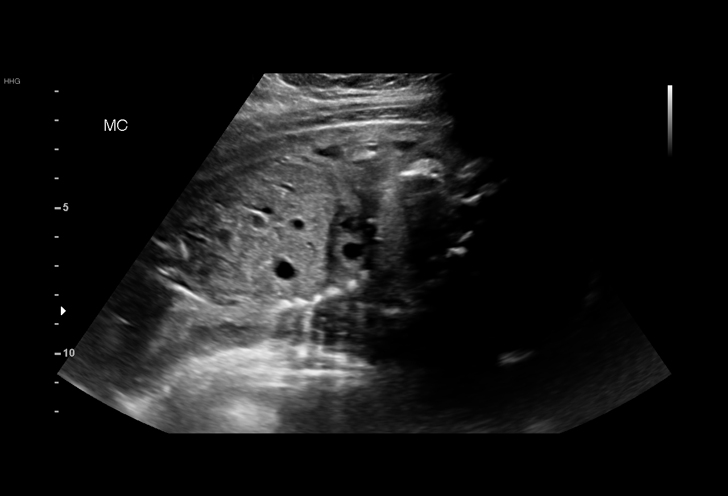
[im 86/86]
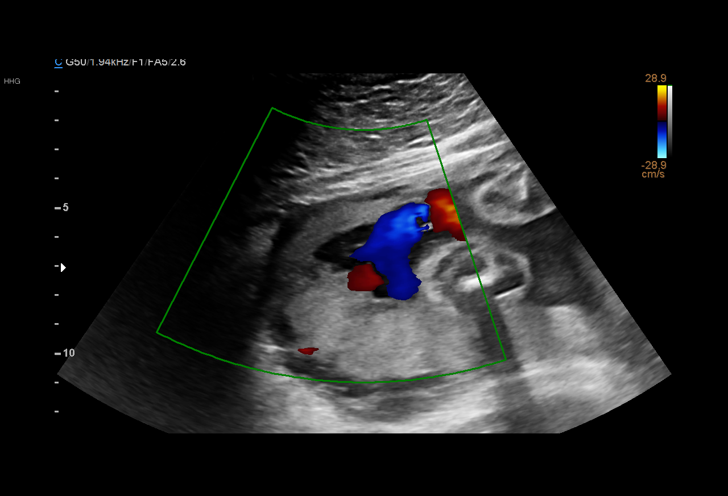

[14 of 28 positions shown; findings below may reference images not displayed]

1  NIVRITO AMILY            [id number]      [id number]     [id number]
Indications

24 weeks gestation of pregnancy
Antenatal follow-up for nonvisualized fetal
anatomy
Teen pregnancy
Anxiety during pregnancy, second trimester     O99.342,
OB History

Blood Type:            Height:  5'5"   Weight (lb):  174       BMI:
Gravidity:    1         Term:   0        Prem:   0         SAB:   0
TOP:          0       Ectopic:  0        Living: 0
Fetal Evaluation

Num Of Fetuses:     1
Fetal Heart         148
Rate(bpm):
Cardiac Activity:   Observed
Presentation:       Cephalic
Placenta:           Posterior Fundal, above cervical os
P. Cord Insertion:  Visualized, central

Amniotic Fluid
AFI FV:      Subjectively within normal limits

Largest Pocket(cm)
4.4
Biometry

BPD:      55.6  mm     G. Age:  23w 0d          5  %    CI:         66.13  %    70 - 86
FL/HC:       19.7  %    18.7 -
HC:      219.3  mm     G. Age:  24w 0d         17  %    HC/AC:       1.13       1.05 -
AC:      194.7  mm     G. Age:  24w 1d         33  %    FL/BPD:      77.5  %    71 - 87
FL:       43.1  mm     G. Age:  24w 1d         27  %    FL/AC:       22.1  %    20 - 24
HUM:      36.7  mm     G. Age:  22w 6d        < 5  %
CER:      25.7  mm     G. Age:  23w 5d         29  %
LV:        7.8  mm
CM:        6.4  mm

Est. FW:     657   gm     1 lb 7 oz     44  %
Gestational Age

LMP:           24w 3d        Date:  09/20/15                 EDD:    06/26/16
U/S Today:     23w 6d                                        EDD:    06/30/16
Best:          24w 3d     Det. By:  LMP  (09/20/15)          EDD:    06/26/16
Anatomy

Cranium:               Appears normal         Aortic Arch:            Previously seen
Cavum:                 Appears normal         Ductal Arch:            Previously seen
Ventricles:            Appears normal         Diaphragm:              Appears normal
Choroid Plexus:        Appears normal         Stomach:                Appears normal, left
sided
Cerebellum:            Appears normal         Abdomen:                Appears normal
Posterior Fossa:       Appears normal         Abdominal Wall:         Previously seen
Nuchal Fold:           Not applicable (>20    Cord Vessels:           Previously seen
wks GA)
Face:                  Appears normal         Kidneys:                Appear normal
(orbits and profile)
Lips:                  Previously seen        Bladder:                Appears normal
Thoracic:              Appears normal         Spine:                  Appears normal
Heart:                 Appears normal         Upper Extremities:      Previously seen
(4CH, axis, and situs
RVOT:                  Appears normal         Lower Extremities:      Previously seen
LVOT:                  Appears normal

Other:  Female gender previously seen. Nasal bone visualized. Heels
previously seen.
Cervix Uterus Adnexa

Cervix
Length:            3.4  cm.
Normal appearance by transabdominal scan.

Uterus
No abnormality visualized.

Left Ovary
Not visualized.

Right Ovary
Not visualized.

Adnexa:       No adnexal mass visualized.
Impression

SIUP at 24+3 weeks
Normal interval anatomy; anatomic survey complete
Normal amniotic fluid volume
Appropriate interval growth with EFW at the 44th %tile
Recommendations

Follow-up as clinically indicated

## 2018-04-26 ENCOUNTER — Encounter: Payer: Self-pay | Admitting: Advanced Practice Midwife

## 2018-04-26 ENCOUNTER — Ambulatory Visit (INDEPENDENT_AMBULATORY_CARE_PROVIDER_SITE_OTHER): Payer: Medicaid Other | Admitting: Advanced Practice Midwife

## 2018-04-26 VITALS — BP 125/72 | HR 73 | Ht 66.0 in | Wt 203.0 lb

## 2018-04-26 DIAGNOSIS — Z0000 Encounter for general adult medical examination without abnormal findings: Secondary | ICD-10-CM | POA: Diagnosis not present

## 2018-04-26 DIAGNOSIS — Z30431 Encounter for routine checking of intrauterine contraceptive device: Secondary | ICD-10-CM

## 2018-04-26 MED ORDER — METRONIDAZOLE 500 MG PO TABS: 500.0000 mg | ORAL_TABLET | Freq: Two times a day (BID) | ORAL | 2 refills | Status: AC

## 2018-04-26 NOTE — Progress Notes (Signed)
  GYNECOLOGY CLINIC PROGRESS NOTE  History:  22 y.o. G1P1001 here today for today for IUD string check; Liletta IUD was placed  11/09/2016. Pt loves IUD but had one IUD placed 07/2016 postpartum that came out with tampon and was replaced 11/09/17.  This week, her partner thought he could feel IUD and she checked and thinks she might feel the bottom of the device at her cervix, not just the string.   Pt denies any current symptoms but reports recurrent BV, usually treated with boric acid suppositories but sometimes requiring Flagyl.  The following portions of the patient's history were reviewed and updated as appropriate: allergies, current medications, past family history, past medical history, past social history, past surgical history and problem list.   Review of Systems:  Pertinent items are noted in HPI.   Objective:  Physical Exam Blood pressure 125/72, pulse 73, height 5\' 6"  (1.676 m), weight 92.1 kg, currently breastfeeding. Gen: NAD Abd: Soft, nontender and nondistended Pelvic: Normal appearing external genitalia; normal appearing vaginal mucosa and cervix.  IUD strings visualized, about 3 cm in length outside cervix. Cotton swab used at cervical os and no device felt inside cervix.  Assessment & Plan:  Normal IUD check. Patient to keep IUD in place for five years; can come in for removal if she desires pregnancy within the next five years. Routine preventative health maintenance measures emphasized. Rx for Flagyl sent to pharmacy to use if BV returns  Sharen CounterLisa Leftwich-Kirby, CNM 3:46 PM

## 2018-04-26 NOTE — Progress Notes (Signed)
Pt states she feels IUD coming out and tried to push it back in

## 2019-09-08 ENCOUNTER — Encounter: Payer: Self-pay | Admitting: Certified Nurse Midwife

## 2019-09-08 ENCOUNTER — Other Ambulatory Visit: Payer: Self-pay

## 2019-09-08 ENCOUNTER — Ambulatory Visit (INDEPENDENT_AMBULATORY_CARE_PROVIDER_SITE_OTHER): Payer: Medicaid Other | Admitting: Certified Nurse Midwife

## 2019-09-08 VITALS — BP 109/63 | HR 93 | Temp 98.4°F | Resp 16 | Ht 66.0 in | Wt 198.0 lb

## 2019-09-08 DIAGNOSIS — Z975 Presence of (intrauterine) contraceptive device: Secondary | ICD-10-CM

## 2019-09-08 DIAGNOSIS — Z3202 Encounter for pregnancy test, result negative: Secondary | ICD-10-CM | POA: Diagnosis not present

## 2019-09-08 DIAGNOSIS — N939 Abnormal uterine and vaginal bleeding, unspecified: Secondary | ICD-10-CM

## 2019-09-08 LAB — HCG, QUANTITATIVE, PREGNANCY: HCG, Total, QN: <3 m[IU]/mL

## 2019-09-08 LAB — POCT URINE PREGNANCY: Preg Test, Ur: NEGATIVE

## 2019-09-08 NOTE — Progress Notes (Signed)
   GYNECOLOGY OFFICE VISIT NOTE  History:  24 y.o. G1P1001 here today for IUD check. She reports abnormal VB in early March so she took a UPT and was positive. She does not usually have cycles with her Liletta IUD. She retested about 2 weeks later with her PCP and UPT was negative. She is no longer having bleeding. She reports occasional pelvic pain. 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 No new sexual partner. No concern for STDs. She wants to make sure her IUD is still protecting her from pregnancy.  Past Medical History:  Diagnosis Date  . Anxiety   . Chlamydia 08/2015  . Depression     No past surgical history on file.  The following portions of the patient's history were reviewed and updated as appropriate: allergies, current medications, past family history, past medical history, past social history, past surgical history and problem list.   Review of Systems:  Genito-Urinary ROS: positive for - change in menstrual cycle, irregular/heavy menses, pelvic pain and vulvar/vaginal symptoms negative for - dysuria or urinary frequency/urgency  Objective:  Physical Exam BP 109/63   Pulse 93   Temp 98.4 F (36.9 C)   Resp 16   Ht 5\' 6"  (1.676 m)   Wt 198 lb (89.8 kg)   Breastfeeding No   BMI 31.96 kg/m  CONSTITUTIONAL: Well-developed, well-nourished female in no acute distress.  HENT:  Normocephalic, atraumatic EYES: Conjunctivae and EOM are normal NECK: Normal range of motion SKIN: Skin is warm and dry NEUROLOGIC: Alert and oriented to person, place, and time PSYCHIATRIC: Normal mood and affect CARDIOVASCULAR: Normal heart rate noted RESPIRATORY: Effort and rate normal ABDOMEN: Soft, no distention  PELVIC: Normal appearing external genitalia; normal appearing vaginal mucosa and cervix.  No abnormal discharge noted.  Normal uterine size, no other palpable masses, no uterine or adnexal tenderness. IUD strings found just inside cervical  canal, approx 1/2 cm long.  MUSCULOSKELETAL: Normal range of motion  Labs and Imaging Results for orders placed or performed in visit on 09/08/19 (from the past 24 hour(s))  POCT urine pregnancy     Status: Normal   Collection Time: 09/08/19 10:57 AM  Result Value Ref Range   Preg Test, Ur Negative Negative    Assessment & Plan:   1. Abnormal uterine bleeding (AUB)   2. IUD (intrauterine device) in place   Pelvic 09/10/19 in 1 week Quant hcg Follow up prn  Total face-to-face time with patient: 15 minutes   [place], Donette Larry 09/08/2019 11:14 AM

## 2019-09-11 ENCOUNTER — Other Ambulatory Visit: Payer: BLUE CROSS/BLUE SHIELD

## 2019-09-12 ENCOUNTER — Telehealth: Payer: Self-pay | Admitting: *Deleted

## 2019-09-12 NOTE — Telephone Encounter (Signed)
Pt notified of neg BHCG results 

## 2019-09-12 NOTE — Telephone Encounter (Signed)
-----   Message from Donette Larry, [place] sent at 09/12/2019 10:06 AM EDT ----- Please notify pt that the blood pregnancy test is negative therefore I don't think her bleeding was related to a pregnancy. Thanks!

## 2020-01-19 ENCOUNTER — Other Ambulatory Visit: Payer: Self-pay

## 2020-01-19 ENCOUNTER — Ambulatory Visit (INDEPENDENT_AMBULATORY_CARE_PROVIDER_SITE_OTHER): Payer: 59 | Admitting: Certified Nurse Midwife

## 2020-01-19 ENCOUNTER — Other Ambulatory Visit (HOSPITAL_COMMUNITY)
Admission: RE | Admit: 2020-01-19 | Discharge: 2020-01-19 | Disposition: A | Discharge status: Home / Self Care | Payer: 59 | Source: Ambulatory Visit | Attending: Certified Nurse Midwife | Admitting: Certified Nurse Midwife

## 2020-01-19 ENCOUNTER — Encounter: Payer: Self-pay | Admitting: Certified Nurse Midwife

## 2020-01-19 VITALS — BP 106/64 | HR 84 | Ht 66.0 in | Wt 218.0 lb

## 2020-01-19 DIAGNOSIS — N898 Other specified noninflammatory disorders of vagina: Secondary | ICD-10-CM

## 2020-01-19 DIAGNOSIS — Z30432 Encounter for removal of intrauterine contraceptive device: Secondary | ICD-10-CM | POA: Diagnosis not present

## 2020-01-19 NOTE — Patient Instructions (Signed)
Natural Family Planning  Natural Family Planning (NFP) is a type of birth control (contraception) in which no form of contraceptive medicine or device is used. The NFP method relies on knowing which days of the month a woman's ovary is producing an egg (ovulation). Ovulation is the time in the menstrual cycle when a woman is most fertile and, therefore, most likely to become pregnant. To lower the chance of pregnancy, sex is avoided during ovulation. NFP is a safe method of birth control and can prevent pregnancy if it is done correctly. However, NFP does not provide protection from sexually transmitted diseases. NFP can also be used as a method of getting pregnant, by deciding to have sex during ovulation. How does the NFP method work? NFP works by making both sexual partners aware of how the woman's body functions during her menstrual cycle.  Usually, a woman has a menstrual period every 28-30 days. However, there can be 23-35 days between each menstrual period. This varies for each woman. A woman with a 28-day menstrual cycle has about 6 days a month when she is most likely to get pregnant.  Ovulation happens 12-14 days before the start of the next menstrual period. There are different methods that are used to determine when ovulation starts.  An egg is fertile for 24 hours after it is released from the ovary. Sperm can live for 3 days or more. What NFP methods can be used to prevent pregnancy? The basal body temperature method During ovulation, there is often a slight increase in body temperature. To use this method:  Take your temperature every morning before getting out of bed. Write the temperature on a chart.  Do not have sex from the day the menstrual periods starts until 3 days after the increase in temperature. Note that body temperature may increase as a result of various factors, including fever, restless sleep, and working schedules. The cervical mucus method Right before  ovulation, mucus from the lower part of the uterus (cervix) changes from dry and sticky to wet and slippery. To use this method:  Check the mucus every day to look for these changes. Ovulation happens on the last day of wet, slippery mucus.  Do not have sex starting when you first see wet, slippery mucus and until 4 days after it stops or returns to its normal consistency.  With this method, it is safe to have sex: ? After the 4 days have passed, and until 10 days after the menstrual period starts. ? On days when mucus is dry. Note that cervical mucus can increase or change consistency due to reasons other than ovulation, such as infection, lubricants, some medicines, and sexual arousal. Other variations of the cervical mucus method include the TwoDay method, [place] ovulation method, and the [employer]. The symptothermal method This method combines the basal body temperature and the cervical mucus methods. The calendar method This method involves tracking menstrual cycles to determine when ovulation occurs. This method is helpful when the menstrual cycle varies in length. To use this method:  For 6 months, record when menstrual periods start and end and the length of each menstrual cycle. The length of a menstrual cycle is from day 1 of the present menstrual period to day 1 of the next menstrual period.  Use this information to determine when you will likely ovulate. Avoid sex during that time. You may need help from your health care provider to determine which days you are most likely to get pregnant. ?  Ovulation usually happens 12-14 days before the start of the next menstrual period. ? Light vaginal bleeding (spotting) or abdominal cramps during the middle of a menstrual cycle may be signs of ovulation. However, not all women have these symptoms. The standard days method This method is based on studies of women's hormone levels throughout normal menstrual cycles. Based on these  studies, a standard rule was developed to predict when women are most fertile during their menstrual cycle. According to the rule, if your cycle is 26-32 days long, you are most fertile between days 8 and 19. To prevent pregnancy, you should avoid having sex during this time, or use a barrier method of birth control. This method works best if your cycle is regularly between 26-32 days long. When should I not use the NFP method? You should not use NFP if:  You have very irregular menstrual periods or you sometimes skip a menstrual period.  You have abnormal vaginal bleeding.  You recently had a baby or are breastfeeding.  You have a vaginal or cervical infection.  You take medicines that can affect vaginal mucus or body temperature. These medicines include antibiotics, thyroid medicines, and antihistamines that are found in some cold and allergy medicines.  You absolutely do not want to become pregnant at the current time. Other methods of contraception are more effective at preventing pregnancy than NFP.  You are concerned about sexually transmitted diseases. Summary  Natural Family Planning methods help women and their partners to understand how to avoid pregnancy without using medicines or other methods.  A woman learns to recognize her most fertile days. A woman with a 28 day menstrual cycle has about 6 days per month when she can get pregnant. This information is not intended to replace advice given to you by your health care provider. Make sure you discuss any questions you have with your health care provider. Document Revised: 09/02/2018 Document Reviewed: 06/29/2016 Elsevier Patient Education  2020 Elsevier Inc.  

## 2020-01-19 NOTE — Progress Notes (Signed)
   GYNECOLOGY OFFICE VISIT NOTE  History:  24 y.o. G1P1001 here today for IUD removal. Reports increased vaginal infections, vaginal pain and dryness over the last 3 yrs. She does not want to become pregnant at this time and will use condoms and NFP.    Past Medical History:  Diagnosis Date  . Anxiety   . Chlamydia 08/2015  . Depression   . H/O eating disorder     History reviewed. No pertinent surgical history.  The following portions of the patient's history were reviewed and updated as appropriate: allergies, current medications, past family history, past medical history, past social history, past surgical history and problem list.   Health Maintenance:  Normal pap per pt at [facility] this year (not on file)   Review of Systems:  Negative except noted in HPI  Objective:  Physical Exam BP 106/64   Pulse 84   Ht 5\' 6"  (1.676 m)   Wt 218 lb (98.9 kg)   BMI 35.19 kg/m  CONSTITUTIONAL: Well-developed, well-nourished female in no acute distress.  HENT:  Normocephalic, atraumatic EYES: Conjunctivae and EOM are normal NECK: Normal range of motion SKIN: Skin is warm and dry NEUROLOGIC: Alert and oriented to person, place, and time PSYCHIATRIC: Normal mood and affect CARDIOVASCULAR: Normal heart rate noted RESPIRATORY: Effort and rate normal PELVIC:Normal appearing external genitalia; normal appearing vaginal mucosa and cervix.  No abnormal discharge noted.  String seen MUSCULOSKELETAL: Normal range of motion  Labs and Imaging UPT-neg  Procedure Note: Pt consented for IUD removal. Pt placed in lithotomy position. Speculum inserted, cervix and IUD strings visualized. Strings grasped with long kelly and IUD removed intact without difficulty. Tolerated well.  Assessment & Plan:   1. Vaginal discharge   2. Encounter for IUD removal    Request copy of records from [facility] (pt to obtain) Follow up prn   [place], Donette Larry 01/19/2020 9:50 AM

## 2020-01-22 ENCOUNTER — Telehealth: Payer: Self-pay | Admitting: *Deleted

## 2020-01-22 LAB — CERVICOVAGINAL ANCILLARY ONLY
Bacterial Vaginitis (gardnerella): NEGATIVE
Candida Glabrata: NEGATIVE
Candida Vaginitis: POSITIVE — AB
Chlamydia: NEGATIVE
Comment: NEGATIVE
Comment: NEGATIVE
Comment: NEGATIVE
Comment: NEGATIVE
Comment: NEGATIVE
Comment: NORMAL
Neisseria Gonorrhea: NEGATIVE
Trichomonas: NEGATIVE

## 2020-01-22 MED ORDER — FLUCONAZOLE 150 MG PO TABS: 150.0000 mg | ORAL_TABLET | Freq: Once | ORAL | 0 refills | Status: AC

## 2020-01-22 NOTE — Telephone Encounter (Signed)
Per Jesse Fall pt needs Diflucan for positive Yeast.  This was sent to [facility] in [place].

## 2020-01-22 NOTE — Telephone Encounter (Signed)
-----   Message from Donette Larry, [place] sent at 01/22/2020 12:40 PM EDT ----- +yeast, please send Diflucan

## 2020-01-23 ENCOUNTER — Telehealth: Payer: Self-pay | Admitting: *Deleted

## 2020-01-23 NOTE — Telephone Encounter (Signed)
Pt notified of positive yeast on [facility]ptima and RX was sent to [facility] in [place].
